# Patient Record
Sex: Female | Born: 1985 | Race: Black or African American | Hispanic: No | Marital: Single | Smoking: Never smoker
Health system: Southern US, Community
[De-identification: ages and names within clinical notes are randomized; demographics above are authoritative.]

## PROBLEM LIST (undated history)

## (undated) ENCOUNTER — Inpatient Hospital Stay (HOSPITAL_COMMUNITY): Payer: Self-pay

## (undated) DIAGNOSIS — O26649 Intrahepatic cholestasis of pregnancy, unspecified trimester: Secondary | ICD-10-CM

## (undated) DIAGNOSIS — O26619 Liver and biliary tract disorders in pregnancy, unspecified trimester: Secondary | ICD-10-CM

## (undated) DIAGNOSIS — F419 Anxiety disorder, unspecified: Secondary | ICD-10-CM

## (undated) DIAGNOSIS — D649 Anemia, unspecified: Secondary | ICD-10-CM

## (undated) DIAGNOSIS — K831 Obstruction of bile duct: Secondary | ICD-10-CM

## (undated) DIAGNOSIS — A749 Chlamydial infection, unspecified: Secondary | ICD-10-CM

## (undated) DIAGNOSIS — G4730 Sleep apnea, unspecified: Secondary | ICD-10-CM

## (undated) DIAGNOSIS — T7840XA Allergy, unspecified, initial encounter: Secondary | ICD-10-CM

## (undated) HISTORY — DX: Allergy, unspecified, initial encounter: T78.40XA

## (undated) HISTORY — PX: CHOLECYSTECTOMY: SHX55

## (undated) HISTORY — DX: Anemia, unspecified: D64.9

## (undated) HISTORY — PX: NO PAST SURGERIES: SHX2092

## (undated) HISTORY — DX: Sleep apnea, unspecified: G47.30

## (undated) HISTORY — DX: Anxiety disorder, unspecified: F41.9

---
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
## 2006-07-19 ENCOUNTER — Other Ambulatory Visit: Admission: RE | Admit: 2006-07-19 | Discharge: 2006-07-19 | Payer: Self-pay | Admitting: Obstetrics and Gynecology

## 2007-02-19 ENCOUNTER — Inpatient Hospital Stay (HOSPITAL_COMMUNITY): Admission: AD | Admit: 2007-02-19 | Discharge: 2007-02-19 | Payer: Self-pay | Admitting: Obstetrics & Gynecology

## 2007-10-11 ENCOUNTER — Emergency Department (HOSPITAL_COMMUNITY): Admission: EM | Admit: 2007-10-11 | Discharge: 2007-10-11 | Payer: Self-pay | Admitting: Emergency Medicine

## 2008-02-05 ENCOUNTER — Other Ambulatory Visit: Admission: RE | Admit: 2008-02-05 | Discharge: 2008-02-05 | Payer: Self-pay | Admitting: Obstetrics and Gynecology

## 2009-03-19 ENCOUNTER — Inpatient Hospital Stay (HOSPITAL_COMMUNITY): Admission: AD | Admit: 2009-03-19 | Discharge: 2009-03-19 | Payer: Self-pay | Admitting: Obstetrics and Gynecology

## 2010-04-12 LAB — HCG, QUANTITATIVE, PREGNANCY: hCG, Beta Chain, Quant, S: 97 m[IU]/mL — ABNORMAL HIGH (ref <5)

## 2010-04-12 LAB — GC/CHLAMYDIA PROBE AMP, GENITAL
Chlamydia, DNA Probe: NEGATIVE
GC Probe Amp, Genital: NEGATIVE

## 2010-04-12 LAB — WET PREP, GENITAL: Yeast Wet Prep HPF POC: NONE SEEN

## 2010-04-12 LAB — URINALYSIS, ROUTINE W REFLEX MICROSCOPIC
Bilirubin Urine: NEGATIVE
Hgb urine dipstick: NEGATIVE
Specific Gravity, Urine: 1.015 (ref 1.005–1.030)

## 2010-04-12 LAB — POCT PREGNANCY, URINE: Preg Test, Ur: POSITIVE

## 2010-10-09 LAB — URINALYSIS, ROUTINE W REFLEX MICROSCOPIC
Glucose, UA: NEGATIVE
Hgb urine dipstick: NEGATIVE
Urobilinogen, UA: 0.2

## 2010-10-09 LAB — WET PREP, GENITAL

## 2010-10-09 LAB — GC/CHLAMYDIA PROBE AMP, GENITAL
Chlamydia, DNA Probe: NEGATIVE
GC Probe Amp, Genital: NEGATIVE

## 2010-10-09 LAB — POCT PREGNANCY, URINE: Preg Test, Ur: NEGATIVE

## 2011-01-19 NOTE — L&D Delivery Note (Signed)
Delivery Note At 3:25 AM a viable and healthy female was delivered via Vaginal, Spontaneous Delivery (Presentation: Left Occiput Anterior).  APGAR: 8, 9; weight pending .   Placenta status: Intact, Spontaneous.  Cord: 3 vessels with the following complications: .  Cord pH: n/a  Anesthesia: Epidural  Episiotomy: None Lacerations: 1st degree bilat vaginal wall; no repair Suture Repair: n/a Est. Blood Loss (mL): 500  Mom to postpartum.  Baby to NICU - preterm   Dr. Maisie Fus attended delivery with me present  [facility] 08/09/2011, 4:01 AM

## 2011-01-21 ENCOUNTER — Encounter (HOSPITAL_COMMUNITY): Payer: Self-pay | Admitting: *Deleted

## 2011-01-21 ENCOUNTER — Inpatient Hospital Stay (HOSPITAL_COMMUNITY): Payer: Medicaid Other

## 2011-01-21 ENCOUNTER — Inpatient Hospital Stay (HOSPITAL_COMMUNITY)
Admission: AD | Admit: 2011-01-21 | Discharge: 2011-01-21 | Disposition: A | Discharge status: Home / Self Care | Payer: Medicaid Other | Source: Ambulatory Visit | Attending: Obstetrics and Gynecology | Admitting: Obstetrics and Gynecology

## 2011-01-21 DIAGNOSIS — R109 Unspecified abdominal pain: Secondary | ICD-10-CM | POA: Insufficient documentation

## 2011-01-21 DIAGNOSIS — Z1389 Encounter for screening for other disorder: Secondary | ICD-10-CM

## 2011-01-21 DIAGNOSIS — O2340 Unspecified infection of urinary tract in pregnancy, unspecified trimester: Secondary | ICD-10-CM

## 2011-01-21 DIAGNOSIS — N390 Urinary tract infection, site not specified: Secondary | ICD-10-CM | POA: Insufficient documentation

## 2011-01-21 DIAGNOSIS — Z3490 Encounter for supervision of normal pregnancy, unspecified, unspecified trimester: Secondary | ICD-10-CM

## 2011-01-21 DIAGNOSIS — O2390 Unspecified genitourinary tract infection in pregnancy, unspecified trimester: Secondary | ICD-10-CM | POA: Insufficient documentation

## 2011-01-21 HISTORY — DX: Chlamydial infection, unspecified: A74.9

## 2011-01-21 LAB — URINALYSIS, ROUTINE W REFLEX MICROSCOPIC
Glucose, UA: NEGATIVE mg/dL
Hgb urine dipstick: NEGATIVE
Protein, ur: NEGATIVE mg/dL
Specific Gravity, Urine: <1.005 — ABNORMAL LOW (ref 1.005–1.030)
Urobilinogen, UA: 0.2 mg/dL (ref 0.0–1.0)
pH: 6 (ref 5.0–8.0)

## 2011-01-21 LAB — CBC
MCH: 28 pg (ref 26.0–34.0)
WBC: 9.3 10*3/uL (ref 4.0–10.5)

## 2011-01-21 LAB — DIFFERENTIAL
Basophils Absolute: 0 10*3/uL (ref 0.0–0.1)
Basophils Relative: 0 % (ref 0–1)
Eosinophils Relative: 1 % (ref 0–5)
Monocytes Absolute: 0.9 10*3/uL (ref 0.1–1.0)
Monocytes Relative: 9 % (ref 3–12)
Neutro Abs: 4.9 10*3/uL (ref 1.7–7.7)
Neutrophils Relative %: 53 % (ref 43–77)

## 2011-01-21 LAB — WET PREP, GENITAL
Trich, Wet Prep: NONE SEEN
Yeast Wet Prep HPF POC: NONE SEEN

## 2011-01-21 LAB — URINE MICROSCOPIC-ADD ON

## 2011-01-21 LAB — POCT PREGNANCY, URINE: Preg Test, Ur: POSITIVE

## 2011-01-21 MED ORDER — CEPHALEXIN 500 MG PO CAPS: 500.0000 mg | ORAL_CAPSULE | Freq: Four times a day (QID) | ORAL | Status: AC

## 2011-01-21 NOTE — ED Provider Notes (Signed)
History     CSN: [id number]  Arrival date & time 01/21/11  1126   None     Chief Complaint  Patient presents with  . Abdominal Pain    HPI Renee Skinner is a 26 y.o. female at approximately [redacted] weeks gestation who presents to [facility] for abdominal pain that started 2 days ago. Denies vaginal bleeding but does have discharge. LMP 11/30/10 Positive HPT. Starting prenatal care with her OB/GYN in [place] but lives in [place] so came here today.   Past Medical History  Diagnosis Date  . Chlamydia     Past Surgical History  Procedure Date  . No past surgeries     No family history on file.  History  Substance Use Topics  . Smoking status: Never Smoker   . Smokeless tobacco: Not on file  . Alcohol Use: No    OB History    Grav Para Term Preterm Abortions TAB SAB Ect Mult Living   2 0   1 1  0  0      Review of Systems  Constitutional: Positive for fatigue. Negative for fever, chills and diaphoresis.  HENT: Negative for ear pain, congestion, sore throat, facial swelling, neck pain, neck stiffness, dental problem and sinus pressure.   Eyes: Negative for photophobia, pain and discharge.  Respiratory: Negative for cough, chest tightness and wheezing.   Cardiovascular: Negative.   Gastrointestinal: Positive for nausea, abdominal pain and constipation. Negative for vomiting, diarrhea and abdominal distention.  Genitourinary: Positive for urgency, frequency and vaginal discharge. Negative for dysuria, flank pain, vaginal bleeding and difficulty urinating.  Musculoskeletal: Positive for back pain. Negative for myalgias and gait problem.  Skin: Negative for color change and rash.  Neurological: Negative for dizziness, speech difficulty, weakness, light-headedness, numbness and headaches.  Psychiatric/Behavioral: Negative for confusion and agitation. The patient is not nervous/anxious.     Allergies  Review of patient's allergies indicates not on file.  Home Medications  No  current outpatient prescriptions on file.  BP 114/68  Pulse 88  Temp(Src) 98.4 F (36.9 C) (Oral)  Resp 20  Ht 5\' 3"  (1.6 m)  Wt 198 lb (89.812 kg)  BMI 35.07 kg/m2  SpO2 99%  LMP 11/30/2010  Physical Exam  Nursing note and vitals reviewed. Constitutional: She is oriented to person, place, and time. She appears well-developed and well-nourished.  HENT:  Head: Normocephalic.  Eyes: EOM are normal.  Neck: Neck supple.  Cardiovascular: Normal rate.   Pulmonary/Chest: Effort normal.  Abdominal: Soft. There is no tenderness.       Unable to reproduce the pain that the patient has had.  Genitourinary:       External genitalia without lesions. White discharge vaginal vault.  Cervix, long, closed, no CMT, no adnexal tenderness. Uterus slightly enlarged.  Musculoskeletal: Normal range of motion.  Neurological: She is alert and oriented to person, place, and time. No cranial nerve deficit.  Skin: Skin is warm and dry.  Psychiatric: She has a normal mood and affect. Her behavior is normal. Judgment and thought content normal.   . Results for orders placed during the hospital encounter of 01/21/11 (from the past 24 hour(s))  URINALYSIS, ROUTINE W REFLEX MICROSCOPIC     Status: Abnormal   Collection Time   01/21/11 12:05 PM      Component Value Range   Color, Urine YELLOW  YELLOW    APPearance CLOUDY (*) CLEAR    Specific Gravity, Urine <1.005 (*) 1.005 - 1.030  pH 6.0  5.0 - 8.0    Glucose, UA NEGATIVE  NEGATIVE (mg/dL)   Hgb urine dipstick NEGATIVE  NEGATIVE    Bilirubin Urine NEGATIVE  NEGATIVE    Ketones, ur NEGATIVE  NEGATIVE (mg/dL)   Protein, ur NEGATIVE  NEGATIVE (mg/dL)   Urobilinogen, UA 0.2  0.0 - 1.0 (mg/dL)   Nitrite NEGATIVE  NEGATIVE    Leukocytes, UA SMALL (*) NEGATIVE   URINE MICROSCOPIC-ADD ON     Status: Abnormal   Collection Time   01/21/11 12:05 PM      Component Value Range   Squamous Epithelial / LPF MANY (*) RARE    WBC, UA 7-10  <3 (WBC/hpf)    Bacteria, UA FEW (*) RARE   POCT PREGNANCY, URINE     Status: Normal   Collection Time   01/21/11 12:09 PM      Component Value Range   Preg Test, Ur POSITIVE    HCG, QUANTITATIVE, PREGNANCY     Status: Abnormal   Collection Time   01/21/11  1:00 PM      Component Value Range   hCG, Beta Chain, Quant, S 47200 (*) <5 (mIU/mL)  CBC     Status: Normal   Collection Time   01/21/11  1:00 PM      Component Value Range   WBC 9.3  4.0 - 10.5 (K/uL)   RBC 4.39  3.87 - 5.11 (MIL/uL)   Hemoglobin 12.3  12.0 - 15.0 (g/dL)   HCT 14.7  82.9 - 56.2 (%)   MCV 84.1  78.0 - 100.0 (fL)   MCH 28.0  26.0 - 34.0 (pg)   MCHC 33.3  30.0 - 36.0 (g/dL)   RDW 13.0  86.5 - 78.4 (%)   Platelets 287  150 - 400 (K/uL)  DIFFERENTIAL     Status: Normal   Collection Time   01/21/11  1:00 PM      Component Value Range   Neutrophils Relative 53  43 - 77 (%)   Neutro Abs 4.9  1.7 - 7.7 (K/uL)   Lymphocytes Relative 36  12 - 46 (%)   Lymphs Abs 3.3  0.7 - 4.0 (K/uL)   Monocytes Relative 9  3 - 12 (%)   Monocytes Absolute 0.9  0.1 - 1.0 (K/uL)   Eosinophils Relative 1  0 - 5 (%)   Eosinophils Absolute 0.1  0.0 - 0.7 (K/uL)   Basophils Relative 0  0 - 1 (%)   Basophils Absolute 0.0  0.0 - 0.1 (K/uL)  WET PREP, GENITAL     Status: Abnormal   Collection Time   01/21/11  1:30 PM      Component Value Range   Yeast, Wet Prep NONE SEEN  NONE SEEN    Trich, Wet Prep NONE SEEN  NONE SEEN    Clue Cells, Wet Prep FEW (*) NONE SEEN    WBC, Wet Prep HPF POC FEW (*) NONE SEEN    Ultrasound today shows a 7 week 3 day IUP with cardiac activity and a CLC on the left.  Assessment: Viable IUP    UTI  Plan:  GC, Chlamydia, UC pending   Keflex 500 mg po qid x 7 days   Follow up with OB in [facility]  ED Course  Procedures MDM          Kerrie Buffalo, NP 01/21/11 1614

## 2011-01-21 NOTE — Progress Notes (Signed)
Patient states she has had a positive pregnancy test about one month ago. Has been having lower abdominal pain for 2 days, worse at night, but no pain at this time, No bleeding but does have a heavy discharge that is normal for her.

## 2011-01-21 NOTE — ED Notes (Addendum)
Pt stated she had positive pregnancy test at her doctors office month ago. [facility] preg test is positive as well.

## 2011-01-22 LAB — GC/CHLAMYDIA PROBE AMP, GENITAL: Chlamydia, DNA Probe: NEGATIVE

## 2011-01-25 NOTE — ED Provider Notes (Signed)
Agree with above note.  Renee Skinner 01/25/2011 10:11 AM   

## 2011-03-31 ENCOUNTER — Ambulatory Visit (INDEPENDENT_AMBULATORY_CARE_PROVIDER_SITE_OTHER): Payer: Medicaid Other | Admitting: Family Medicine

## 2011-03-31 ENCOUNTER — Other Ambulatory Visit: Payer: Self-pay | Admitting: Obstetrics and Gynecology

## 2011-03-31 VITALS — BP 121/77 | Wt 209.0 lb

## 2011-03-31 DIAGNOSIS — Z3480 Encounter for supervision of other normal pregnancy, unspecified trimester: Secondary | ICD-10-CM

## 2011-03-31 NOTE — Progress Notes (Signed)
Patient is here for new ob intake.  She is doing well, but has had not had prenatal care except for an ultrasound done at [facility] at 7wks 3days.  I offered genetic testing but they would like to think about it at this point, they will let us know when they return for the next ob visit in two weeks. Baby is active on ultrasound with positive fetal heart rate.

## 2011-04-01 LAB — OBSTETRIC PANEL
Eosinophils Relative: 1 % (ref 0–5)
Hepatitis B Surface Ag: NEGATIVE
Lymphocytes Relative: 33 % (ref 12–46)
Lymphs Abs: 3.8 10*3/uL (ref 0.7–4.0)
MCV: 84.3 fL (ref 78.0–100.0)
Neutrophils Relative %: 59 % (ref 43–77)
Platelets: 287 10*3/uL (ref 150–400)
RBC: 4.14 MIL/uL (ref 3.87–5.11)
Rubella: 45.6 IU/mL — ABNORMAL HIGH
WBC: 11.4 10*3/uL — ABNORMAL HIGH (ref 4.0–10.5)

## 2011-04-07 ENCOUNTER — Ambulatory Visit (HOSPITAL_COMMUNITY)
Admission: RE | Admit: 2011-04-07 | Discharge: 2011-04-07 | Disposition: A | Discharge status: Home / Self Care | Payer: Medicaid Other | Source: Ambulatory Visit | Attending: Obstetrics and Gynecology | Admitting: Obstetrics and Gynecology

## 2011-04-07 DIAGNOSIS — Z363 Encounter for antenatal screening for malformations: Secondary | ICD-10-CM | POA: Insufficient documentation

## 2011-04-07 DIAGNOSIS — O358XX0 Maternal care for other (suspected) fetal abnormality and damage, not applicable or unspecified: Secondary | ICD-10-CM | POA: Insufficient documentation

## 2011-04-07 DIAGNOSIS — Z3480 Encounter for supervision of other normal pregnancy, unspecified trimester: Secondary | ICD-10-CM

## 2011-04-07 DIAGNOSIS — Z1389 Encounter for screening for other disorder: Secondary | ICD-10-CM | POA: Insufficient documentation

## 2011-04-09 ENCOUNTER — Ambulatory Visit (HOSPITAL_COMMUNITY): Payer: Medicaid Other

## 2011-04-19 ENCOUNTER — Ambulatory Visit (INDEPENDENT_AMBULATORY_CARE_PROVIDER_SITE_OTHER): Payer: Medicaid Other | Admitting: Family Medicine

## 2011-04-19 ENCOUNTER — Other Ambulatory Visit (HOSPITAL_COMMUNITY)
Admission: RE | Admit: 2011-04-19 | Discharge: 2011-04-19 | Disposition: A | Discharge status: Home / Self Care | Payer: Medicaid Other | Source: Ambulatory Visit | Attending: Family Medicine | Admitting: Family Medicine

## 2011-04-19 ENCOUNTER — Encounter: Payer: Self-pay | Admitting: Family Medicine

## 2011-04-19 VITALS — BP 124/78 | Wt 214.0 lb

## 2011-04-19 DIAGNOSIS — O0990 Supervision of high risk pregnancy, unspecified, unspecified trimester: Secondary | ICD-10-CM | POA: Insufficient documentation

## 2011-04-19 DIAGNOSIS — G5603 Carpal tunnel syndrome, bilateral upper limbs: Secondary | ICD-10-CM | POA: Insufficient documentation

## 2011-04-19 DIAGNOSIS — Z3400 Encounter for supervision of normal first pregnancy, unspecified trimester: Secondary | ICD-10-CM

## 2011-04-19 DIAGNOSIS — G5600 Carpal tunnel syndrome, unspecified upper limb: Secondary | ICD-10-CM

## 2011-04-19 DIAGNOSIS — Z01419 Encounter for gynecological examination (general) (routine) without abnormal findings: Secondary | ICD-10-CM | POA: Insufficient documentation

## 2011-04-19 NOTE — Progress Notes (Signed)
[id number] 

## 2011-04-19 NOTE — Progress Notes (Signed)
   Subjective:    Renee Skinner is a G2P0010 [redacted]w[redacted]d being seen today for her first obstetrical visit.  Her obstetrical history is significant for nothing. Patient does intend to breast feed. Pregnancy history fully reviewed.  Patient reports no complaints.  Filed Vitals:   04/19/11 1518  BP: 124/78  Weight: 214 lb (97.07 kg)    HISTORY: OB History    Grav Para Term Preterm Abortions TAB SAB Ect Mult Living   2 0   1 1  0  0     # Outc Date GA Lbr Len/2nd Wgt Sex Del Anes PTL Lv   1 TAB            2 CUR              Past Medical History  Diagnosis Date  . Chlamydia    Past Surgical History  Procedure Date  . No past surgeries    Family History  Problem Relation Age of Onset  . Hypertension Father   . Cancer Maternal Grandmother     Breast/tn  52 when she passed     Exam    Uterine Size: size equals dates  Pelvic Exam:    Perineum: Normal Perineum   Vulva: normal   Vagina:  normal mucosa       Cervix: no lesions   Adnexa: normal adnexa   Bony Pelvis: average  System: Breast:  normal appearance, no masses or tenderness   Skin: normal coloration and turgor, no rashes    Neurologic: oriented, normal   Extremities: Strength, tone and muscle mass are abnormal.   HEENT sclera clear, anicteric, neck supple with midline trachea and thyroid without masses   Mouth/Teeth mucous membranes moist, pharynx normal without lesions   Neck supple   Cardiovascular: regular rate and rhythm   Respiratory:  appears well, vitals normal, no respiratory distress, acyanotic, normal RR, ear and throat exam is normal, neck free of mass or lymphadenopathy, chest clear, no wheezing, crepitations, rhonchi, normal symmetric air entry   Abdomen: soft, non-tender; bowel sounds normal; no masses,  no organomegaly   Urinary: urethral meatus normal      Assessment:    Pregnancy: G2P0010 Patient Active Problem List  Diagnoses  . Supervision of normal first pregnancy        Plan:      Initial labs drawn. Prenatal vitamins. Problem list reviewed and updated. Genetic Screening discussed Quad Screen: declined.  Ultrasound discussed; fetal survey: results reviewed.  Follow up in 4 weeks.  Wrist splints for carpal tunnel, other wrist hygiene discussed. Aftan Vint S 04/19/2011

## 2011-04-19 NOTE — Patient Instructions (Signed)
Pregnancy - Second Trimester The second trimester of pregnancy (3 to 6 months) is a period of rapid growth for you and your baby. At the end of the sixth month, your baby is about 9 inches long and weighs 1 1/2 pounds. You will begin to feel the baby move between 18 and 20 weeks of the pregnancy. This is called quickening. Weight gain is faster. A clear fluid (colostrum) may leak out of your breasts. You may feel small contractions of the womb (uterus). This is known as false labor or Braxton-Hicks contractions. This is like a practice for labor when the baby is ready to be born. Usually, the problems with morning sickness have usually passed by the end of your first trimester. Some women develop small dark blotches (called cholasma, mask of pregnancy) on their face that usually goes away after the baby is born. Exposure to the sun makes the blotches worse. Acne may also develop in some pregnant women and pregnant women who have acne, may find that it goes away. PRENATAL EXAMS  Blood work may continue to be done during prenatal exams. These tests are done to check on your health and the probable health of your baby. Blood work is used to follow your blood levels (hemoglobin). Anemia (low hemoglobin) is common during pregnancy. Iron and vitamins are given to help prevent this. You will also be checked for diabetes between 24 and 28 weeks of the pregnancy. Some of the previous blood tests may be repeated.   The size of the uterus is measured during each visit. This is to make sure that the baby is continuing to grow properly according to the dates of the pregnancy.   Your blood pressure is checked every prenatal visit. This is to make sure you are not getting toxemia.   Your urine is checked to make sure you do not have an infection, diabetes or protein in the urine.   Your weight is checked often to make sure gains are happening at the suggested rate. This is to ensure that both you and your baby are  growing normally.   Sometimes, an ultrasound is performed to confirm the proper growth and development of the baby. This is a test which bounces harmless sound waves off the baby so your caregiver can more accurately determine due dates.  Sometimes, a specialized test is done on the amniotic fluid surrounding the baby. This test is called an amniocentesis. The amniotic fluid is obtained by sticking a needle into the belly (abdomen). This is done to check the chromosomes in instances where there is a concern about possible genetic problems with the baby. It is also sometimes done near the end of pregnancy if an early delivery is required. In this case, it is done to help make sure the baby's lungs are mature enough for the baby to live outside of the womb. CHANGES OCCURING IN THE SECOND TRIMESTER OF PREGNANCY Your body goes through many changes during pregnancy. They vary from person to person. Talk to your caregiver about changes you notice that you are concerned about.  During the second trimester, you will likely have an increase in your appetite. It is normal to have cravings for certain foods. This varies from person to person and pregnancy to pregnancy.   Your lower abdomen will begin to bulge.   You may have to urinate more often because the uterus and baby are pressing on your bladder. It is also common to get more bladder infections during pregnancy (  pain with urination). You can help this by drinking lots of fluids and emptying your bladder before and after intercourse.   You may begin to get stretch marks on your hips, abdomen, and breasts. These are normal changes in the body during pregnancy. There are no exercises or medications to take that prevent this change.   You may begin to develop swollen and bulging veins (varicose veins) in your legs. Wearing support hose, elevating your feet for 15 minutes, 3 to 4 times a day and limiting salt in your diet helps lessen the problem.    Heartburn may develop as the uterus grows and pushes up against the stomach. Antacids recommended by your caregiver helps with this problem. Also, eating smaller meals 4 to 5 times a day helps.   Constipation can be treated with a stool softener or adding bulk to your diet. Drinking lots of fluids, vegetables, fruits, and whole grains are helpful.   Exercising is also helpful. If you have been very active up until your pregnancy, most of these activities can be continued during your pregnancy. If you have been less active, it is helpful to start an exercise program such as walking.   Hemorrhoids (varicose veins in the rectum) may develop at the end of the second trimester. Warm sitz baths and hemorrhoid cream recommended by your caregiver helps hemorrhoid problems.   Backaches may develop during this time of your pregnancy. Avoid heavy lifting, wear low heal shoes and practice good posture to help with backache problems.   Some pregnant women develop tingling and numbness of their hand and fingers because of swelling and tightening of ligaments in the wrist (carpel tunnel syndrome). This goes away after the baby is born.   As your breasts enlarge, you may have to get a bigger bra. Get a comfortable, cotton, support bra. Do not get a nursing bra until the last month of the pregnancy if you will be nursing the baby.   You may get a dark line from your belly button to the pubic area called the linea nigra.   You may develop rosy cheeks because of increase blood flow to the face.   You may develop spider looking lines of the face, neck, arms and chest. These go away after the baby is born.  HOME CARE INSTRUCTIONS   It is extremely important to avoid all smoking, herbs, alcohol, and unprescribed drugs during your pregnancy. These chemicals affect the formation and growth of the baby. Avoid these chemicals throughout the pregnancy to ensure the delivery of a healthy infant.   Most of your home  care instructions are the same as suggested for the first trimester of your pregnancy. Keep your caregiver's appointments. Follow your caregiver's instructions regarding medication use, exercise and diet.   During pregnancy, you are providing food for you and your baby. Continue to eat regular, well-balanced meals. Choose foods such as meat, fish, milk and other low fat dairy products, vegetables, fruits, and whole-grain breads and cereals. Your caregiver will tell you of the ideal weight gain.   A physical sexual relationship may be continued up until near the end of pregnancy if there are no other problems. Problems could include early (premature) leaking of amniotic fluid from the membranes, vaginal bleeding, abdominal pain, or other medical or pregnancy problems.   Exercise regularly if there are no restrictions. Check with your caregiver if you are unsure of the safety of some of your exercises. The greatest weight gain will occur in the   last 2 trimesters of pregnancy. Exercise will help you:   Control your weight.   Get you in shape for labor and delivery.   Lose weight after you have the baby.   Wear a good support or jogging bra for breast tenderness during pregnancy. This may help if worn during sleep. Pads or tissues may be used in the bra if you are leaking colostrum.   Do not use hot tubs, steam rooms or saunas throughout the pregnancy.   Wear your seat belt at all times when driving. This protects you and your baby if you are in an accident.   Avoid raw meat, uncooked cheese, cat litter boxes and soil used by cats. These carry germs that can cause birth defects in the baby.   The second trimester is also a good time to visit your dentist for your dental health if this has not been done yet. Getting your teeth cleaned is OK. Use a soft toothbrush. Brush gently during pregnancy.   It is easier to loose urine during pregnancy. Tightening up and strengthening the pelvic muscles will  help with this problem. Practice stopping your urination while you are going to the bathroom. These are the same muscles you need to strengthen. It is also the muscles you would use as if you were trying to stop from passing gas. You can practice tightening these muscles up 10 times a set and repeating this about 3 times per day. Once you know what muscles to tighten up, do not perform these exercises during urination. It is more likely to contribute to an infection by backing up the urine.   Ask for help if you have financial, counseling or nutritional needs during pregnancy. Your caregiver will be able to offer counseling for these needs as well as refer you for other special needs.   Your skin may become oily. If so, wash your face with mild soap, use non-greasy moisturizer and oil or cream based makeup.  MEDICATIONS AND DRUG USE IN PREGNANCY  Take prenatal vitamins as directed. The vitamin should contain 1 milligram of folic acid. Keep all vitamins out of reach of children. Only a couple vitamins or tablets containing iron may be fatal to a baby or young child when ingested.   Avoid use of all medications, including herbs, over-the-counter medications, not prescribed or suggested by your caregiver. Only take over-the-counter or prescription medicines for pain, discomfort, or fever as directed by your caregiver. Do not use aspirin.   Let your caregiver also know about herbs you may be using.   Alcohol is related to a number of birth defects. This includes fetal alcohol syndrome. All alcohol, in any form, should be avoided completely. Smoking will cause low birth rate and premature babies.   Street or illegal drugs are very harmful to the baby. They are absolutely forbidden. A baby born to an addicted mother will be addicted at birth. The baby will go through the same withdrawal an adult does.  SEEK MEDICAL CARE IF:  You have any concerns or worries during your pregnancy. It is better to call with  your questions if you feel they cannot wait, rather than worry about them. SEEK IMMEDIATE MEDICAL CARE IF:   An unexplained oral temperature above 102 F (38.9 C) develops, or as your caregiver suggests.   You have leaking of fluid from the vagina (birth canal). If leaking membranes are suspected, take your temperature and tell your caregiver of this when you call.   There   is vaginal spotting, bleeding, or passing clots. Tell your caregiver of the amount and how many pads are used. Light spotting in pregnancy is common, especially following intercourse.   You develop a bad smelling vaginal discharge with a change in the color from clear to white.   You continue to feel sick to your stomach (nauseated) and have no relief from remedies suggested. You vomit blood or coffee ground-like materials.   You lose more than 2 pounds of weight or gain more than 2 pounds of weight over 1 week, or as suggested by your caregiver.   You notice swelling of your face, hands, feet, or legs.   You get exposed to German measles and have never had them.   You are exposed to fifth disease or chickenpox.   You develop belly (abdominal) pain. Round ligament discomfort is a common non-cancerous (benign) cause of abdominal pain in pregnancy. Your caregiver still must evaluate you.   You develop a bad headache that does not go away.   You develop fever, diarrhea, pain with urination, or shortness of breath.   You develop visual problems, blurry, or double vision.   You fall or are in a car accident or any kind of trauma.   There is mental or physical violence at home.  Document Released: 12/29/2000 Document Revised: 12/24/2010 Document Reviewed: 07/03/2008 [employer] Patient Information 2012 [employer]. Breastfeeding BENEFITS OF BREASTFEEDING For the baby  The first milk (colostrum) helps the baby's digestive system function better.   There are antibodies from the mother in the milk that help the  baby fight off infections.   The baby has a lower incidence of asthma, allergies, and SIDS (sudden infant death syndrome).   The nutrients in breast milk are better than formulas for the baby and helps the baby's brain grow better.   Babies who breastfeed have less gas, colic, and constipation.  For the mother  Breastfeeding helps develop a very special bond between mother and baby.   It is more convenient, always available at the correct temperature and cheaper than formula feeding.   It burns calories in the mother and helps with losing weight that was gained during pregnancy.   It makes the uterus contract back down to normal size faster and slows bleeding following delivery.   Breastfeeding mothers have a lower risk of developing breast cancer.  NURSE FREQUENTLY  A healthy, full-term baby may breastfeed as often as every hour or space his or her feedings to every 3 hours.   How often to nurse will vary from baby to baby. Watch your baby for signs of hunger, not the clock.   Nurse as often as the baby requests, or when you feel the need to reduce the fullness of your breasts.   Awaken the baby if it has been 3 to 4 hours since the last feeding.   Frequent feeding will help the mother make more milk and will prevent problems like sore nipples and engorgement of the breasts.  BABY'S POSITION AT THE BREAST  Whether lying down or sitting, be sure that the baby's tummy is facing your tummy.   Support the breast with 4 fingers underneath the breast and the thumb above. Make sure your fingers are well away from the nipple and baby's mouth.   Stroke the baby's lips and cheek closest to the breast gently with your finger or nipple.   When the baby's mouth is open wide enough, place all of your nipple and as much   of the dark area around the nipple as possible into your baby's mouth.   Pull the baby in close so the tip of the nose and the baby's cheeks touch the breast during the  feeding.  FEEDINGS  The length of each feeding varies from baby to baby and from feeding to feeding.   The baby must suck about 2 to 3 minutes for your milk to get to him or her. This is called a "let down." For this reason, allow the baby to feed on each breast as long as he or she wants. Your baby will end the feeding when he or she has received the right balance of nutrients.   To break the suction, put your finger into the corner of the baby's mouth and slide it between his or her gums before removing your breast from his or her mouth. This will help prevent sore nipples.  REDUCING BREAST ENGORGEMENT  In the first week after your baby is born, you may experience signs of breast engorgement. When breasts are engorged, they feel heavy, warm, full, and may be tender to the touch. You can reduce engorgement if you:   Nurse frequently, every 2 to 3 hours. Mothers who breastfeed early and often have fewer problems with engorgement.   Place light ice packs on your breasts between feedings. This reduces swelling. Wrap the ice packs in a lightweight towel to protect your skin.   Apply moist hot packs to your breast for 5 to 10 minutes before each feeding. This increases circulation and helps the milk flow.   Gently massage your breast before and during the feeding.   Make sure that the baby empties at least one breast at every feeding before switching sides.   Use a breast pump to empty the breasts if your baby is sleepy or not nursing well. You may also want to pump if you are returning to work or or you feel you are getting engorged.   Avoid bottle feeds, pacifiers or supplemental feedings of water or juice in place of breastfeeding.   Be sure the baby is latched on and positioned properly while breastfeeding.   Prevent fatigue, stress, and anemia.   Wear a supportive bra, avoiding underwire styles.   Eat a balanced diet with enough fluids.  If you follow these suggestions, your  engorgement should improve in 24 to 48 hours. If you are still experiencing difficulty, call your lactation consultant or caregiver. IS MY BABY GETTING ENOUGH MILK? Sometimes, mothers worry about whether their babies are getting enough milk. You can be assured that your baby is getting enough milk if:  The baby is actively sucking and you hear swallowing.   The baby nurses at least 8 to 12 times in a 24 hour time period. Nurse your baby until he or she unlatches or falls asleep at the first breast (at least 10 to 20 minutes), then offer the second side.   The baby is wetting 5 to 6 disposable diapers (6 to 8 cloth diapers) in a 24 hour period by 5 to 6 days of age.   The baby is having at least 2 to 3 stools every 24 hours for the first few months. Breast milk is all the food your baby needs. It is not necessary for your baby to have water or formula. In fact, to help your breasts make more milk, it is best not to give your baby supplemental feedings during the early weeks.   The stool should   be soft and yellow.   The baby should gain 4 to 7 ounces per week after he is 4 days old.  TAKE CARE OF YOURSELF Take care of your breasts by:  Bathing or showering daily.   Avoiding the use of soaps on your nipples.   Start feedings on your left breast at one feeding and on your right breast at the next feeding.   You will notice an increase in your milk supply 2 to 5 days after delivery. You may feel some discomfort from engorgement, which makes your breasts very firm and often tender. Engorgement "peaks" out within 24 to 48 hours. In the meantime, apply warm moist towels to your breasts for 5 to 10 minutes before feeding. Gentle massage and expression of some milk before feeding will soften your breasts, making it easier for your baby to latch on. Wear a well fitting nursing bra and air dry your nipples for 10 to 15 minutes after each feeding.   Only use cotton bra pads.   Only use pure lanolin on  your nipples after nursing. You do not need to wash it off before nursing.  Take care of yourself by:   Eating well-balanced meals and nutritious snacks.   Drinking milk, fruit juice, and water to satisfy your thirst (about 8 glasses a day).   Getting plenty of rest.   Increasing calcium in your diet (1200 mg a day).   Avoiding foods that you notice affect the baby in a bad way.  SEEK MEDICAL CARE IF:   You have any questions or difficulty with breastfeeding.   You need help.   You have a hard, red, sore area on your breast, accompanied by a fever of 100.5 F (38.1 C) or more.   Your baby is too sleepy to eat well or is having trouble sleeping.   Your baby is wetting less than 6 diapers per day, by 5 days of age.   Your baby's skin or white part of his or her eyes is more yellow than it was in the hospital.   You feel depressed.  Document Released: 01/04/2005 Document Revised: 12/24/2010 Document Reviewed: 08/19/2008 [employer] Patient Information 2012 [employer]. 

## 2011-04-22 ENCOUNTER — Encounter: Payer: Self-pay | Admitting: Family Medicine

## 2011-05-17 ENCOUNTER — Telehealth: Payer: Self-pay | Admitting: *Deleted

## 2011-05-17 DIAGNOSIS — Z3400 Encounter for supervision of normal first pregnancy, unspecified trimester: Secondary | ICD-10-CM

## 2011-05-17 MED ORDER — PRENATAL MULTIVITAMIN CH: 1.0000 | ORAL_TABLET | Freq: Every day | ORAL | Status: DC

## 2011-05-17 NOTE — Telephone Encounter (Signed)
Patient needs prenatal vitamins called into [facility].Marland Kitchen

## 2011-05-18 ENCOUNTER — Ambulatory Visit (INDEPENDENT_AMBULATORY_CARE_PROVIDER_SITE_OTHER): Payer: Medicaid Other | Admitting: Obstetrics and Gynecology

## 2011-05-18 VITALS — BP 121/75 | Wt 222.0 lb

## 2011-05-18 DIAGNOSIS — G5603 Carpal tunnel syndrome, bilateral upper limbs: Secondary | ICD-10-CM

## 2011-05-18 DIAGNOSIS — Z3400 Encounter for supervision of normal first pregnancy, unspecified trimester: Secondary | ICD-10-CM

## 2011-05-18 DIAGNOSIS — G5600 Carpal tunnel syndrome, unspecified upper limb: Secondary | ICD-10-CM

## 2011-05-18 NOTE — Progress Notes (Signed)
Patient doing well without complaints. 1hr GCT next visit

## 2011-05-18 NOTE — Progress Notes (Signed)
Patient is here for routine visit, she is doing well.

## 2011-06-15 ENCOUNTER — Ambulatory Visit (INDEPENDENT_AMBULATORY_CARE_PROVIDER_SITE_OTHER): Payer: Medicaid Other | Admitting: Obstetrics & Gynecology

## 2011-06-15 ENCOUNTER — Encounter: Payer: Self-pay | Admitting: Obstetrics & Gynecology

## 2011-06-15 VITALS — BP 109/75 | Wt 224.0 lb

## 2011-06-15 DIAGNOSIS — Z3400 Encounter for supervision of normal first pregnancy, unspecified trimester: Secondary | ICD-10-CM

## 2011-06-15 NOTE — Progress Notes (Signed)
Routine visit. Labs and glucola today. No problems except allergies. OTC meds recommended.

## 2011-06-16 LAB — CBC
HCT: 33.7 % — ABNORMAL LOW (ref 36.0–46.0)
MCV: 86.2 fL (ref 78.0–100.0)
RBC: 3.91 MIL/uL (ref 3.87–5.11)
WBC: 11.3 10*3/uL — ABNORMAL HIGH (ref 4.0–10.5)

## 2011-06-18 ENCOUNTER — Telehealth: Payer: Self-pay | Admitting: *Deleted

## 2011-06-18 NOTE — Telephone Encounter (Signed)
Patient was notified of glucose tolerance test results and will come in Monday for her 3 hr screening.  She was advised not to eat or drink anything after midnight.

## 2011-06-21 ENCOUNTER — Other Ambulatory Visit (INDEPENDENT_AMBULATORY_CARE_PROVIDER_SITE_OTHER): Payer: Medicaid Other | Admitting: *Deleted

## 2011-06-21 DIAGNOSIS — R7309 Other abnormal glucose: Secondary | ICD-10-CM

## 2011-06-21 NOTE — Progress Notes (Signed)
Patient is here for 3 hour glucose tolerance test. 

## 2011-06-22 LAB — GLUCOSE TOLERANCE, 3 HOURS
Glucose Tolerance, 2 hour: 115 mg/dL (ref 70–164)
Glucose, GTT - 3 Hour: 96 mg/dL (ref 70–144)

## 2011-06-29 ENCOUNTER — Ambulatory Visit (INDEPENDENT_AMBULATORY_CARE_PROVIDER_SITE_OTHER): Payer: Medicaid Other | Admitting: Obstetrics & Gynecology

## 2011-06-29 VITALS — BP 108/74 | Wt 226.0 lb

## 2011-06-29 DIAGNOSIS — Z3400 Encounter for supervision of normal first pregnancy, unspecified trimester: Secondary | ICD-10-CM

## 2011-06-29 NOTE — Progress Notes (Signed)
Abnormal 1 hr GTT, passed 3 hr GTT.  No other complaints or concerns.  Fetal movement and labor precautions reviewed.

## 2011-06-29 NOTE — Patient Instructions (Signed)
Breastfeeding BENEFITS OF BREASTFEEDING For the baby  The first milk (colostrum) helps the baby's digestive system function better.   There are antibodies from the mother in the milk that help the baby fight off infections.   The baby has a lower incidence of asthma, allergies, and SIDS (sudden infant death syndrome).   The nutrients in breast milk are better than formulas for the baby and helps the baby's brain grow better.   Babies who breastfeed have less gas, colic, and constipation.  For the mother  Breastfeeding helps develop a very special bond between mother and baby.   It is more convenient, always available at the correct temperature and cheaper than formula feeding.   It burns calories in the mother and helps with losing weight that was gained during pregnancy.   It makes the uterus contract back down to normal size faster and slows bleeding following delivery.   Breastfeeding mothers have a lower risk of developing breast cancer.  NURSE FREQUENTLY  A healthy, full-term baby may breastfeed as often as every hour or space his or her feedings to every 3 hours.   How often to nurse will vary from baby to baby. Watch your baby for signs of hunger, not the clock.   Nurse as often as the baby requests, or when you feel the need to reduce the fullness of your breasts.   Awaken the baby if it has been 3 to 4 hours since the last feeding.   Frequent feeding will help the mother make more milk and will prevent problems like sore nipples and engorgement of the breasts.  BABY'S POSITION AT THE BREAST  Whether lying down or sitting, be sure that the baby's tummy is facing your tummy.   Support the breast with 4 fingers underneath the breast and the thumb above. Make sure your fingers are well away from the nipple and baby's mouth.   Stroke the baby's lips and cheek closest to the breast gently with your finger or nipple.   When the baby's mouth is open wide enough, place all  of your nipple and as much of the dark area around the nipple as possible into your baby's mouth.   Pull the baby in close so the tip of the nose and the baby's cheeks touch the breast during the feeding.  FEEDINGS  The length of each feeding varies from baby to baby and from feeding to feeding.   The baby must suck about 2 to 3 minutes for your milk to get to him or her. This is called a "let down." For this reason, allow the baby to feed on each breast as long as he or she wants. Your baby will end the feeding when he or she has received the right balance of nutrients.   To break the suction, put your finger into the corner of the baby's mouth and slide it between his or her gums before removing your breast from his or her mouth. This will help prevent sore nipples.  REDUCING BREAST ENGORGEMENT  In the first week after your baby is born, you may experience signs of breast engorgement. When breasts are engorged, they feel heavy, warm, full, and may be tender to the touch. You can reduce engorgement if you:   Nurse frequently, every 2 to 3 hours. Mothers who breastfeed early and often have fewer problems with engorgement.   Place light ice packs on your breasts between feedings. This reduces swelling. Wrap the ice packs in a   lightweight towel to protect your skin.   Apply moist hot packs to your breast for 5 to 10 minutes before each feeding. This increases circulation and helps the milk flow.   Gently massage your breast before and during the feeding.   Make sure that the baby empties at least one breast at every feeding before switching sides.   Use a breast pump to empty the breasts if your baby is sleepy or not nursing well. You may also want to pump if you are returning to work or or you feel you are getting engorged.   Avoid bottle feeds, pacifiers or supplemental feedings of water or juice in place of breastfeeding.   Be sure the baby is latched on and positioned properly while  breastfeeding.   Prevent fatigue, stress, and anemia.   Wear a supportive bra, avoiding underwire styles.   Eat a balanced diet with enough fluids.  If you follow these suggestions, your engorgement should improve in 24 to 48 hours. If you are still experiencing difficulty, call your lactation consultant or caregiver. IS MY BABY GETTING ENOUGH MILK? Sometimes, mothers worry about whether their babies are getting enough milk. You can be assured that your baby is getting enough milk if:  The baby is actively sucking and you hear swallowing.   The baby nurses at least 8 to 12 times in a 24 hour time period. Nurse your baby until he or she unlatches or falls asleep at the first breast (at least 10 to 20 minutes), then offer the second side.   The baby is wetting 5 to 6 disposable diapers (6 to 8 cloth diapers) in a 24 hour period by 5 to 6 days of age.   The baby is having at least 2 to 3 stools every 24 hours for the first few months. Breast milk is all the food your baby needs. It is not necessary for your baby to have water or formula. In fact, to help your breasts make more milk, it is best not to give your baby supplemental feedings during the early weeks.   The stool should be soft and yellow.   The baby should gain 4 to 7 ounces per week after he is 4 days old.  TAKE CARE OF YOURSELF Take care of your breasts by:  Bathing or showering daily.   Avoiding the use of soaps on your nipples.   Start feedings on your left breast at one feeding and on your right breast at the next feeding.   You will notice an increase in your milk supply 2 to 5 days after delivery. You may feel some discomfort from engorgement, which makes your breasts very firm and often tender. Engorgement "peaks" out within 24 to 48 hours. In the meantime, apply warm moist towels to your breasts for 5 to 10 minutes before feeding. Gentle massage and expression of some milk before feeding will soften your breasts, making  it easier for your baby to latch on. Wear a well fitting nursing bra and air dry your nipples for 10 to 15 minutes after each feeding.   Only use cotton bra pads.   Only use pure lanolin on your nipples after nursing. You do not need to wash it off before nursing.  Take care of yourself by:   Eating well-balanced meals and nutritious snacks.   Drinking milk, fruit juice, and water to satisfy your thirst (about 8 glasses a day).   Getting plenty of rest.   Increasing calcium in   your diet (1200 mg a day).   Avoiding foods that you notice affect the baby in a bad way.  SEEK MEDICAL CARE IF:   You have any questions or difficulty with breastfeeding.   You need help.   You have a hard, red, sore area on your breast, accompanied by a fever of 100.5 F (38.1 C) or more.   Your baby is too sleepy to eat well or is having trouble sleeping.   Your baby is wetting less than 6 diapers per day, by 5 days of age.   Your baby's skin or white part of his or her eyes is more yellow than it was in the hospital.   You feel depressed.  Document Released: 01/04/2005 Document Revised: 12/24/2010 Document Reviewed: 08/19/2008 [employer] Patient Information 2012 [employer]. 

## 2011-06-29 NOTE — Progress Notes (Signed)
Routine prenatal check. 

## 2011-07-13 ENCOUNTER — Encounter: Payer: Self-pay | Admitting: Obstetrics & Gynecology

## 2011-07-13 ENCOUNTER — Ambulatory Visit (INDEPENDENT_AMBULATORY_CARE_PROVIDER_SITE_OTHER): Payer: Medicaid Other | Admitting: Obstetrics & Gynecology

## 2011-07-13 VITALS — BP 115/70 | Wt 230.0 lb

## 2011-07-13 DIAGNOSIS — Z3400 Encounter for supervision of normal first pregnancy, unspecified trimester: Secondary | ICD-10-CM

## 2011-07-13 NOTE — Progress Notes (Signed)
Routine visit. No problems. Denies VB, ROM, CTXs. Reports good FM. We discussed risks of excess weight gain during pregnancy.

## 2011-07-15 ENCOUNTER — Encounter (HOSPITAL_COMMUNITY): Payer: Self-pay | Admitting: *Deleted

## 2011-07-15 ENCOUNTER — Inpatient Hospital Stay (HOSPITAL_COMMUNITY)
Admission: AD | Admit: 2011-07-15 | Discharge: 2011-07-15 | Disposition: A | Discharge status: Home / Self Care | Payer: Medicaid Other | Source: Ambulatory Visit | Attending: Obstetrics & Gynecology | Admitting: Obstetrics & Gynecology

## 2011-07-15 DIAGNOSIS — Z331 Pregnant state, incidental: Secondary | ICD-10-CM

## 2011-07-15 DIAGNOSIS — R748 Abnormal levels of other serum enzymes: Secondary | ICD-10-CM

## 2011-07-15 DIAGNOSIS — IMO0002 Reserved for concepts with insufficient information to code with codable children: Secondary | ICD-10-CM | POA: Insufficient documentation

## 2011-07-15 DIAGNOSIS — Z3400 Encounter for supervision of normal first pregnancy, unspecified trimester: Secondary | ICD-10-CM

## 2011-07-15 LAB — URINE MICROSCOPIC-ADD ON

## 2011-07-15 LAB — COMPREHENSIVE METABOLIC PANEL
AST: 76 U/L — ABNORMAL HIGH (ref 0–37)
BUN: 8 mg/dL (ref 6–23)
CO2: 23 mEq/L (ref 19–32)
Calcium: 9 mg/dL (ref 8.4–10.5)
Creatinine, Ser: 0.61 mg/dL (ref 0.50–1.10)
GFR calc Af Amer: >90 mL/min (ref >90)
GFR calc non Af Amer: >90 mL/min (ref >90)
Total Bilirubin: 0.2 mg/dL — ABNORMAL LOW (ref 0.3–1.2)

## 2011-07-15 LAB — CBC
HCT: 33.9 % — ABNORMAL LOW (ref 36.0–46.0)
MCH: 27.6 pg (ref 26.0–34.0)
MCV: 85 fL (ref 78.0–100.0)
Platelets: 313 10*3/uL (ref 150–400)
RBC: 3.99 MIL/uL (ref 3.87–5.11)

## 2011-07-15 LAB — PROTEIN / CREATININE RATIO, URINE
Creatinine, Urine: 110.62 mg/dL
Protein Creatinine Ratio: 0.14 (ref 0.00–0.15)
Total Protein, Urine: 15.5 mg/dL

## 2011-07-15 LAB — URINALYSIS, ROUTINE W REFLEX MICROSCOPIC
Bilirubin Urine: NEGATIVE
Nitrite: NEGATIVE
Protein, ur: NEGATIVE mg/dL
Specific Gravity, Urine: 1.025 (ref 1.005–1.030)
Urobilinogen, UA: 0.2 mg/dL (ref 0.0–1.0)

## 2011-07-15 MED ORDER — NIFEDIPINE 10 MG PO CAPS
10.0000 mg | ORAL_CAPSULE | ORAL | Status: AC
  Administered 2011-07-15 (×3): 10 mg via ORAL
  Filled 2011-07-15 (×3): qty 1

## 2011-07-15 NOTE — MAU Provider Note (Signed)
History     CSN: [id number]  Arrival date and time: 07/15/11 7829   None     Chief Complaint  Patient presents with  . Leg Swelling   HPI  Renee Skinner is a 26 yo AAF [id number] [redacted]w[redacted]d who presents with a 2 d h/o bilateral leg swelling. Pt reports she has had leg swelling, usually relieved by elevation. Swelling not relieved by elevation, epsom salt bath overf the past 2 days. Associated sxs include heat intolerance and palpitations. PT denies any recent drastic WG, CP, urinary sxs. Pt denies any change to activity or any new medications.  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  Pt endorses Braxton Hick's contractions and fetal movement, but denies any LOF or bleeding.     OB History    Grav Para Term Preterm Abortions TAB SAB Ect Mult Living   2 0   1 1  0  0      Past Medical History  Diagnosis Date  . Chlamydia     Past Surgical History  Procedure Date  . No past surgeries     Family History  Problem Relation Age of Onset  . Hypertension Father   . Cancer Maternal Grandmother     Breast/tn  72 when she passed    History  Substance Use Topics  . Smoking status: Never Smoker   . Smokeless tobacco: Not on file  . Alcohol Use: No    Allergies: No Known Allergies  Prescriptions prior to admission  Medication Sig Dispense Refill  . Prenatal Vit-Fe Fumarate-FA (PRENATAL MULTIVITAMIN) TABS Take 1 tablet by mouth daily.  30 tablet  11    Review of Systems  Constitutional: Negative for fever, chills and malaise/fatigue.  HENT: Negative for ear pain, congestion and sore throat.   Eyes: Negative for blurred vision, double vision, photophobia and pain.       "spots" in vision, intermittent, last experienced 3 days ago  Respiratory: Positive for shortness of breath. Negative for cough and wheezing.   Cardiovascular:  Positive for palpitations (wil supine position or sitting at work, intermittent, last felt a few days ago). Negative for chest pain and orthopnea.  Gastrointestinal: Negative for heartburn, nausea, vomiting, abdominal pain, diarrhea, constipation and blood in stool.  Genitourinary: Negative for dysuria, urgency, frequency and hematuria.  Skin: Negative.   Neurological: Positive for sensory change (bil foot "tingling" with swelling). Negative for dizziness, seizures, weakness and headaches.  Psychiatric/Behavioral: Negative for depression and substance abuse. The patient is not nervous/anxious.    Physical Exam   Blood pressure 116/71, pulse 139, temperature 97.8 F (36.6 C), temperature source Oral, resp. rate 18, height 5\' 2"  (1.575 m), weight 232 lb (105.235 kg), last menstrual period 11/30/2010, SpO2 100.00%.  Physical Exam  Constitutional: She is oriented to person, place, and time. She appears well-developed and well-nourished. No distress.  HENT:  Head: Normocephalic.  Eyes: Pupils are equal, round, and reactive to light. No scleral icterus.  Neck: Normal range of motion.  Cardiovascular: Regular rhythm, normal heart sounds and intact distal pulses.   No murmur heard.      Tachycardic 116  Respiratory: Effort normal and breath sounds normal. No respiratory distress. She has no wheezes.  GI: Soft. Bowel sounds are normal. She exhibits distension. There is tenderness ("uncomfortable" RLQ "tightness"). There  is no rebound, no guarding, no CVA tenderness and no tenderness at McBurney's point.  Musculoskeletal: She exhibits edema (nonpitting, bil LE to knee).       NEG bil Homan's, no palpable cords, erythema or tenderness  Neurological: She is alert and oriented to person, place, and time. She has normal strength. No sensory deficit (light touch and pain intact bil LE ).  Reflex Scores:      Patellar reflexes are 2+ on the right side and 2+ on the left side.      Achilles reflexes are  2+ on the right side and 2+ on the left side. Skin: Skin is warm and dry. No erythema.   Abd: gravid, non tender Cervix: closed/long/thick/post FHR: Cat I tracing, irregular contractions with UI.  Results for orders placed during the hospital encounter of 07/15/11 (from the past 24 hour(s))  URINALYSIS, ROUTINE W REFLEX MICROSCOPIC     Status: Abnormal   Collection Time   07/15/11  9:35 AM      Component Value Range   Color, Urine YELLOW  YELLOW   APPearance CLEAR  CLEAR   Specific Gravity, Urine 1.025  1.005 - 1.030   pH 7.0  5.0 - 8.0   Glucose, UA NEGATIVE  NEGATIVE mg/dL   Hgb urine dipstick NEGATIVE  NEGATIVE   Bilirubin Urine NEGATIVE  NEGATIVE   Ketones, ur NEGATIVE  NEGATIVE mg/dL   Protein, ur NEGATIVE  NEGATIVE mg/dL   Urobilinogen, UA 0.2  0.0 - 1.0 mg/dL   Nitrite NEGATIVE  NEGATIVE   Leukocytes, UA SMALL (*) NEGATIVE  URINE MICROSCOPIC-ADD ON     Status: Abnormal   Collection Time   07/15/11  9:35 AM      Component Value Range   Squamous Epithelial / LPF MANY (*) RARE   WBC, UA 3-6  <3 WBC/hpf   RBC / HPF 0-2  <3 RBC/hpf   Bacteria, UA RARE  RARE  PROTEIN / CREATININE RATIO, URINE     Status: Normal   Collection Time   07/15/11  9:35 AM      Component Value Range   Creatinine, Urine 110.62     Total Protein, Urine 15.5     PROTEIN CREATININE RATIO 0.14  0.00 - 0.15  CBC     Status: Abnormal   Collection Time   07/15/11 10:52 AM      Component Value Range   WBC 10.5  4.0 - 10.5 K/uL   RBC 3.99  3.87 - 5.11 MIL/uL   Hemoglobin 11.0 (*) 12.0 - 15.0 g/dL   HCT 40.9 (*) 81.1 - 91.4 %   MCV 85.0  78.0 - 100.0 fL   MCH 27.6  26.0 - 34.0 pg   MCHC 32.4  30.0 - 36.0 g/dL   RDW 78.2  95.6 - 21.3 %   Platelets 313  150 - 400 K/uL  COMPREHENSIVE METABOLIC PANEL     Status: Abnormal   Collection Time   07/15/11 10:52 AM      Component Value Range   Sodium 135  135 - 145 mEq/L   Potassium 3.8  3.5 - 5.1 mEq/L   Chloride 103  96 - 112 mEq/L   CO2 23  19 - 32 mEq/L     Glucose, Bld 105 (*) 70 - 99 mg/dL   BUN 8  6 - 23 mg/dL   Creatinine, Ser 0.86  0.50 - 1.10 mg/dL   Calcium 9.0  8.4 - 57.8 mg/dL   Total Protein  6.9  6.0 - 8.3 g/dL   Albumin 2.6 (*) 3.5 - 5.2 g/dL   AST 76 (*) 0 - 37 U/L   ALT 171 (*) 0 - 35 U/L   Alkaline Phosphatase 261 (*) 39 - 117 U/L   Total Bilirubin 0.2 (*) 0.3 - 1.2 mg/dL   GFR calc non Af Amer >90  >90 mL/min   GFR calc Af Amer >90  >90 mL/min   MAU Course  Procedures  MDM UA - small leukocytes CBC - mild anemia CMP - low albumin, elevated LFTs Orthostatics EKG - sinus tachycardia Urine Pro/Creat: .014 Procardia: 30 mg   MD Consult: Discussed with Dr. Debroah Loop agrees with plan  Assessment and Plan  1. LE Edema 2. Elevated liver enzyme with unknown etiology  Discharge home Precautions reviewed FU Monday at [facility] office to repeat bloodwork   Delania Ferg E. 07/15/2011, 2:04 PM

## 2011-07-15 NOTE — MAU Note (Signed)
Patient state she has had swelling in the lower legs and feet for 2 days. Swelling usually goes away but has not. Having some mild contractions. Denies any bleeding or leaking and reports good fetal movement, Denies any headaches or visual problems.

## 2011-07-15 NOTE — Discharge Instructions (Signed)
Hypertension During Pregnancy Hypertension is also called high blood pressure. It can occur at any time in life and during pregnancy. When you have hypertension, there is extra pressure inside your blood vessels that carry blood from the heart to the rest of your body (arteries). Hypertension during pregnancy can cause problems for you and your baby. Your baby might not weigh as much as it should at birth or might be born early (premature). Very bad cases of hypertension during pregnancy can be life-threatening.  There are different types of hypertension during pregnancy.   Chronic hypertension. This happens when a woman has hypertension before pregnancy and it continues during pregnancy.   Gestational hypertension. This is when hypertension develops during pregnancy.   Preeclampsia or toxemia of pregnancy. This is a very serious type of hypertension that develops only during pregnancy. It is a disease that affects the whole body (systemic) and can be very dangerous for both mother and baby.   Gestational hypertension and preeclampsia usually go away after your baby is born. Blood pressure generally stabilizes within 6 weeks. Women who have hypertension during pregnancy have a greater chance of developing hypertension later in life or with future pregnancies. UNDERSTANDING BLOOD PRESSURE Blood pressure moves blood in your body. Sometimes, the force that moves the blood becomes too strong.  A blood pressure reading is given in 2 numbers and looks like a fraction.   The top number is called the systolic pressure. When your heart beats, it forces more blood to flow through the arteries. Pressure inside the arteries goes up.   The bottom number is the diastolic pressure. Pressure goes down between beats. That is when the heart is resting.   You may have hypertension if:   Your systolic blood pressure is above 140.   Your diastolic pressure is above 90.  RISK FACTORS Some factors make you more  likely to develop hypertension during pregnancy. Risk factors include:  Having hypertension before pregnancy.   Having hypertension during a previous pregnancy.   Being overweight.   Being older than 40.   Being pregnant with more than 1 baby (multiples).   Having diabetes or kidney problems.  SYMPTOMS Chronic and gestational hypertension may not cause symptoms. Preeclampsia has symptoms, which may include:  Increased protein in your urine. Your caregiver will check for this at every prenatal visit.   Swelling of your hands and face.   Rapid weight gain.   Headaches.   Visual changes.   Being bothered by light.   Abdominal pain, especially in the right upper area.   Chest pain.   Shortness of breath.   Increased reflexes.   Seizures. Seizures occur with a more severe form of preeclampsia, called eclampsia.  DIAGNOSIS   You may be diagnosed with hypertension during pregnancy during a regular prenatal exam. At each visit, tests may include:   Blood pressure checks.   A urine test to check for protein in your urine.   The type of hypertension you are diagnosed with depends on when you developed it. It also depends on your specific blood pressure reading.   Developing hypertension before 20 weeks of pregnancy is consistent with chronic hypertension.   Developing hypertension after 20 weeks of pregnancy is consistent with gestational hypertension.   Hypertension with increased urinary protein is diagnosed as preeclampsia.   Blood pressure measurements that stay above 160 systolic or 110 diastolic are a sign of severe preeclampsia.  TREATMENT Treatment for hypertension during pregnancy varies. Treatment depends on   the type of hypertension and how serious it is.  If you take medicine for chronic hypertension, you may need to switch medicines.   Drugs called ACE inhibitors should not be taken during pregnancy.   Low-dose aspirin may be suggested for women who have  risk factors for preeclampsia.   If you have gestational hypertension, you may need to take a blood pressure medicine that is safe during pregnancy. Your caregiver will recommend the appropriate medicine.   If you have severe preeclampsia, you may need to be in the hospital. Caregivers will watch you and the baby very closely. You also may need to take medicine (magnesium sulfate) to prevent seizures and lower blood pressure.   Sometimes an early delivery is needed. This may be the case if the condition worsens. It would be done to protect you and the baby. The only cure for preeclampsia is delivery.  HOME CARE INSTRUCTIONS  Schedule and keep all of your regular prenatal care.   Follow your caregiver's instructions for taking medicines. Tell your caregiver about all medicines you take. This includes over-the-counter medicines.   Eat as little salt as possible.   Get regular exercise.   Do not drink alcohol.   Do not use tobacco products.   Do not drink products with caffeine.   Lie on your left side when resting.   Tell your doctor if you have any preeclampsia symptoms.  SEEK IMMEDIATE MEDICAL CARE IF:  You have severe abdominal pain.   You have sudden swelling in the hands, ankles, or face.   You gain 4 pounds (1.8 kg) or more in 1 week.   You vomit repeatedly.   You have vaginal bleeding.   You do not feel the baby moving as much.   You have a headache.   You have blurred or double vision.   You have muscle twitching or spasms.   You have shortness of breath.   You have blue fingernails and lips.   You have blood in your urine.  MAKE SURE YOU:  Understand these instructions.   Will watch your condition.   Will get help right away if you are not doing well.  Document Released: 09/22/2010 Document Revised: 12/24/2010 Document Reviewed: 09/22/2010 [facility] Patient Information 2012 [place], [place].Preterm Labor Preterm labor is when labor starts at less than  37 weeks of pregnancy. The normal length of a pregnancy is 39 to 41 weeks. CAUSES Often, there is no identifiable underlying cause as to why a woman goes into preterm labor. However, one of the most common known causes of preterm labor is infection. Infections of the uterus, cervix, vagina, amniotic sac, bladder, kidney, or even the lungs (pneumonia) can cause labor to start. Other causes of preterm labor include:  Urogenital infections, such as yeast infections and bacterial vaginosis.   Uterine abnormalities (uterine shape, uterine septum, fibroids, bleeding from the placenta).   A cervix that has been operated on and opens prematurely.   Malformations in the baby.   Multiple gestations (twins, triplets, and so on).   Breakage of the amniotic sac.  Additional risk factors for preterm labor include:  Previous history of preterm labor.   Premature rupture of membranes (PROM).   A placenta that covers the opening of the cervix (placenta previa).   A placenta that separates from the uterus (placenta abruption).   A cervix that is too weak to hold the baby in the uterus (incompetence cervix).   Having too much fluid in the amniotic sac (  polyhydramnios).   Taking illegal drugs or smoking while pregnant.   Not gaining enough weight while pregnant.   Women younger than 21 and older than 25 years old.   Low socioeconomic status.   African-American ethnicity.  SYMPTOMS Signs and symptoms of preterm labor include:  Menstrual-like cramps.   Contractions that are 30 to 70 seconds apart, become very regular, closer together, and are more intense and painful.   Contractions that start on the top of the uterus and spread down to the lower abdomen and back.   A sense of increased pelvic pressure or back pain.   A watery or bloody discharge that comes from the vagina.  DIAGNOSIS  A diagnosis can be confirmed by:  A vaginal exam.   An ultrasound of the cervix.   Sampling  (swabbing) cervico-vaginal secretions. These samples can be tested for the presence of fetal fibronectin. This is a protein found in cervical discharge which is associated with preterm labor.   Fetal monitoring.  TREATMENT  Depending on the length of the pregnancy and other circumstances, a caregiver may suggest bed rest. If necessary, there are medicines that can be given to stop contractions and to quicken fetal lung maturity. If labor happens before 34 weeks of pregnancy, a prolonged hospital stay may be recommended. Treatment depends on the condition of both the mother and baby. PREVENTION There are some things a mother can do to lower the risk of preterm labor in future pregnancies. A woman can:   Stop smoking.   Maintain healthy weight gain and avoid chemicals and drugs that are not necessary.   Be watchful for any type of infection.   Inform her caregiver if she has a known history of preterm labor.  Document Released: 03/27/2003 Document Revised: 12/24/2010 Document Reviewed: 05/01/2010 [facility] Patient Information 2012 [place], [place].

## 2011-07-19 ENCOUNTER — Ambulatory Visit (INDEPENDENT_AMBULATORY_CARE_PROVIDER_SITE_OTHER): Payer: Medicaid Other | Admitting: Obstetrics & Gynecology

## 2011-07-19 VITALS — BP 119/75 | Wt 230.0 lb

## 2011-07-19 DIAGNOSIS — R748 Abnormal levels of other serum enzymes: Secondary | ICD-10-CM

## 2011-07-19 DIAGNOSIS — Z3400 Encounter for supervision of normal first pregnancy, unspecified trimester: Secondary | ICD-10-CM

## 2011-07-19 NOTE — Progress Notes (Signed)
Seen in MAU on 07/15/11  for BLE edema, workup revealed elevated LFTs.  No preeclampsia symptoms. No other complaints or concerns.  Will recheck CMET today to follow up LFTS, normal BP today. Fetal movement and labor precautions reviewed.

## 2011-07-19 NOTE — Patient Instructions (Signed)
Return to clinic for any obstetric concerns or go to MAU for evaluation  

## 2011-07-20 ENCOUNTER — Encounter: Payer: Self-pay | Admitting: Obstetrics & Gynecology

## 2011-07-20 LAB — COMPREHENSIVE METABOLIC PANEL
ALT: 177 U/L — ABNORMAL HIGH (ref 0–35)
AST: 107 U/L — ABNORMAL HIGH (ref 0–37)
Alkaline Phosphatase: 259 U/L — ABNORMAL HIGH (ref 39–117)
BUN: 11 mg/dL (ref 6–23)
Chloride: 109 mEq/L (ref 96–112)
Creat: 0.53 mg/dL (ref 0.50–1.10)
Total Bilirubin: 0.3 mg/dL (ref 0.3–1.2)

## 2011-07-20 NOTE — Addendum Note (Signed)
Addended by: Jaynie Collins A on: 07/20/2011 01:33 PM   Modules accepted: Orders

## 2011-07-20 NOTE — Progress Notes (Signed)
Patient needs more labs for evaluation of persistent elevated LFTs. Needs repeat CMET, CBC, bile acids, amylase, lipase, hepatitis panel checked. She will be called to make appointment for lab draw.  Follow up results and manage accordingly.

## 2011-07-21 ENCOUNTER — Other Ambulatory Visit (INDEPENDENT_AMBULATORY_CARE_PROVIDER_SITE_OTHER): Payer: Medicaid Other | Admitting: Gynecology

## 2011-07-21 DIAGNOSIS — K831 Obstruction of bile duct: Secondary | ICD-10-CM

## 2011-07-21 DIAGNOSIS — R748 Abnormal levels of other serum enzymes: Secondary | ICD-10-CM

## 2011-07-21 LAB — CBC
HCT: 34.8 % — ABNORMAL LOW (ref 36.0–46.0)
Hemoglobin: 11.4 g/dL — ABNORMAL LOW (ref 12.0–15.0)
MCH: 27.5 pg (ref 26.0–34.0)
MCHC: 32.8 g/dL (ref 30.0–36.0)
MCV: 84.1 fL (ref 78.0–100.0)
RDW: 14.7 % (ref 11.5–15.5)

## 2011-07-21 NOTE — Progress Notes (Signed)
Spoke with patient regarding her labs result. Per Patient will be coming in office today for recollection of blood drawn.

## 2011-07-22 LAB — COMPREHENSIVE METABOLIC PANEL
ALT: 163 U/L — ABNORMAL HIGH (ref 0–35)
Albumin: 3.4 g/dL — ABNORMAL LOW (ref 3.5–5.2)
CO2: 24 mEq/L (ref 19–32)
Chloride: 105 mEq/L (ref 96–112)
Glucose, Bld: 110 mg/dL — ABNORMAL HIGH (ref 70–99)
Potassium: 3.7 mEq/L (ref 3.5–5.3)
Sodium: 139 mEq/L (ref 135–145)
Total Bilirubin: 0.2 mg/dL — ABNORMAL LOW (ref 0.3–1.2)
Total Protein: 6.9 g/dL (ref 6.0–8.3)

## 2011-07-23 ENCOUNTER — Ambulatory Visit (HOSPITAL_COMMUNITY)
Admission: RE | Admit: 2011-07-23 | Discharge: 2011-07-23 | Disposition: A | Discharge status: Home / Self Care | Payer: Medicaid Other | Source: Ambulatory Visit | Attending: Obstetrics & Gynecology | Admitting: Obstetrics & Gynecology

## 2011-07-23 DIAGNOSIS — Z3689 Encounter for other specified antenatal screening: Secondary | ICD-10-CM | POA: Insufficient documentation

## 2011-07-23 DIAGNOSIS — O26619 Liver and biliary tract disorders in pregnancy, unspecified trimester: Secondary | ICD-10-CM | POA: Insufficient documentation

## 2011-07-23 DIAGNOSIS — K838 Other specified diseases of biliary tract: Secondary | ICD-10-CM | POA: Insufficient documentation

## 2011-07-23 LAB — BILE ACIDS, TOTAL: Bile Acids Total: 32 umol/L — ABNORMAL HIGH (ref 0–19)

## 2011-07-23 MED ORDER — URSODIOL 500 MG PO TABS: 500.0000 mg | ORAL_TABLET | Freq: Two times a day (BID) | ORAL | Status: DC

## 2011-07-23 NOTE — Addendum Note (Signed)
Addended by: Jaynie Collins A on: 07/23/2011 02:29 PM   Modules accepted: Orders

## 2011-07-23 NOTE — Progress Notes (Addendum)
Attending Result Note Patient with elevated LFTs, labs for evaluation are as reported:  Results for orders placed in visit on 07/21/11 (from the past 48 hour(s))  CBC     Status: Abnormal   Collection Time   07/21/11  3:03 PM      Component Value Range Comment   WBC 10.8 (*) 4.0 - 10.5 K/uL    RBC 4.14  3.87 - 5.11 MIL/uL    Hemoglobin 11.4 (*) 12.0 - 15.0 g/dL    HCT 16.1 (*) 09.6 - 46.0 %    MCV 84.1  78.0 - 100.0 fL    MCH 27.5  26.0 - 34.0 pg    MCHC 32.8  30.0 - 36.0 g/dL    RDW 04.5  40.9 - 81.1 %    Platelets 343  150 - 400 K/uL   COMPREHENSIVE METABOLIC PANEL     Status: Abnormal   Collection Time   07/21/11  3:03 PM      Component Value Range Comment   Sodium 139  135 - 145 mEq/L    Potassium 3.7  3.5 - 5.3 mEq/L    Chloride 105  96 - 112 mEq/L    CO2 24  19 - 32 mEq/L    Glucose, Bld 110 (*) 70 - 99 mg/dL    BUN 11  6 - 23 mg/dL    Creat 9.14  7.82 - 9.56 mg/dL    Total Bilirubin 0.2 (*) 0.3 - 1.2 mg/dL    Alkaline Phosphatase 283 (*) 39 - 117 U/L    AST 79 (*) 0 - 37 U/L    ALT 163 (*) 0 - 35 U/L    Total Protein 6.9  6.0 - 8.3 g/dL    Albumin 3.4 (*) 3.5 - 5.2 g/dL    Calcium 9.2  8.4 - 21.3 mg/dL   BILE ACIDS, TOTAL     Status: Abnormal   Collection Time   07/21/11  3:03 PM      Component Value Range Comment   Bile Acids Total 32 (*) 0 - 19 umol/L   HEPATITIS PANEL, ACUTE     Status: Normal (Preliminary result)   Collection Time   07/21/11  3:03 PM      Component Value Range Comment   Hepatitis B Surface Ag        Hep B C IgM        Hep A IgM        HCV Ab    NEGATIVE   LIPASE     Status: Normal   Collection Time   07/21/11  3:03 PM      Component Value Range Comment   Lipase 24  0 - 75 U/L   AMYLASE     Status: Normal   Collection Time   07/21/11  3:03 PM      Component Value Range Comment   Amylase 38  0 - 105 U/L    Discussed results with patient; she has elevated bile acids concerning for intrahepatic cholestasis of pregnancy (ICP).  Patient reports  having pruritus for the past couple of days on palms and soles of feet, and on abdomen with no rash. She was told this was expected with ICP. Increased fetal morbidoty/mortality risks discussed with patient.  Prescribed Ursodiol 500 mg po bid; she will be on this until the end of the pregnancy.  Patient also told to come today for ultrasound for growth and BPP today; she will start 2x/week testing and the  plan will be to deliver her at [redacted] weeks gestation or earlier if indicated. This plan was discussed with the patient; she will be set up for antenatal testing in clinic next week.   Jaynie Collins, M.D. 07/23/2011 2:34 PM

## 2011-07-26 ENCOUNTER — Other Ambulatory Visit: Payer: Self-pay

## 2011-07-26 DIAGNOSIS — O26619 Liver and biliary tract disorders in pregnancy, unspecified trimester: Secondary | ICD-10-CM

## 2011-07-26 DIAGNOSIS — K831 Obstruction of bile duct: Secondary | ICD-10-CM

## 2011-07-26 DIAGNOSIS — R748 Abnormal levels of other serum enzymes: Secondary | ICD-10-CM

## 2011-07-26 LAB — HEPATITIS PANEL, ACUTE
HCV Ab: NEGATIVE
Hepatitis B Surface Ag: NEGATIVE

## 2011-07-26 MED ORDER — URSODIOL 500 MG PO TABS: 500.0000 mg | ORAL_TABLET | Freq: Two times a day (BID) | ORAL | Status: DC

## 2011-07-26 NOTE — Telephone Encounter (Signed)
Rx was e-prescribed to [facility] as her default pharmacy. It has now been reordered to [facility] as per patient request, patient called to inform her to pick up medication. Will recheck LFTs, bile salts on Wednesday during her prenatal visit.

## 2011-07-26 NOTE — Addendum Note (Signed)
Addended by: Jaynie Collins A on: 07/26/2011 01:18 PM   Modules accepted: Orders

## 2011-07-26 NOTE — Telephone Encounter (Signed)
Hi Dr. Macon Large, I just got a call from Laverda Sorenson she states she spoke to you on Thursday she was having some itching. We are following her up on her elevated liver enzymes. She said she was suppose to get a medication called in her pharmacy but when she went to pick it up it was not there. I'm not sure what medication that was since it is not on her list. She could not remember what the medication was either but she needs it called [facility] to [facility] on [address]. Let me know if you need me to do anything else.

## 2011-07-28 ENCOUNTER — Ambulatory Visit (INDEPENDENT_AMBULATORY_CARE_PROVIDER_SITE_OTHER): Payer: Medicaid Other | Admitting: Obstetrics & Gynecology

## 2011-07-28 ENCOUNTER — Other Ambulatory Visit: Payer: Self-pay | Admitting: Obstetrics & Gynecology

## 2011-07-28 VITALS — BP 120/75 | Wt 230.0 lb

## 2011-07-28 DIAGNOSIS — K838 Other specified diseases of biliary tract: Secondary | ICD-10-CM

## 2011-07-28 DIAGNOSIS — K839 Disease of biliary tract, unspecified: Secondary | ICD-10-CM

## 2011-07-28 DIAGNOSIS — O26619 Liver and biliary tract disorders in pregnancy, unspecified trimester: Secondary | ICD-10-CM

## 2011-07-28 DIAGNOSIS — K831 Obstruction of bile duct: Secondary | ICD-10-CM

## 2011-07-28 DIAGNOSIS — K769 Liver disease, unspecified: Secondary | ICD-10-CM

## 2011-07-28 DIAGNOSIS — O0990 Supervision of high risk pregnancy, unspecified, unspecified trimester: Secondary | ICD-10-CM

## 2011-07-28 NOTE — Progress Notes (Signed)
Still itching. [place] 7/6 c/w 82%ile, cephalic

## 2011-07-28 NOTE — Patient Instructions (Signed)
Cholestasis of Pregnancy Cholestasis is a stopping or restriction of bile flow from the gallbladder into the intestine. It can be mild to severe. This problem usually happens during the final trimester of pregnancy. This condition occurs because the liver is not getting rid of its bile. The bile is part of the breakdown products of red blood cells. When these breakdown products (bilirubin) become elevated in the liver and spill into the blood, they produce jaundice. Jaundice is a yellowing of the skin. It is often seen first in the whites of the eyes. As the bilirubin becomes more elevated, it causes itching and sometimes a rash of the skin. CAUSES   Having problems inside or outside of the liver (gallstones blocking the opening of the gallbladder).   During pregnancy, it may be due to the elevated estrogen in the blood.   A response to the estrogen in birth control pills.   Drug or alcohol use.   Certain types of anemia (low red blood cells) present before the pregnancy. Let your caregiver know if you have anemia.   Jaundice in pregnancy is also seen with severe toxemia, severe and continuous vomiting (hyperemesis gravidarum), and acute fatty liver of pregnancy.   Other causes of jaundice that should be ruled out are hepatitis, chronic liver disease (cirrhosis), autoimmune hepatitis, and severe infectious mononucleosis.  This problem is usually harmless. However, it can cause early(premature) birth and threaten infant health. This type of jaundice is similar to jaundice of the newborn. If bilirubin levels become high enough, it can cause mental retardation in the baby. SYMPTOMS   Itching, mild to severe.   Jaundice.   Rash at times.  DIAGNOSIS   Increase amount of bilirubin in the blood stream.   The patient has yellow skin and the white of their eyes (jaundice).  TREATMENT  This problem disappears with delivery of the baby. Treatment is usually directed at controlling the itching. If  the problem is severe and the jaundice reaches dangerous levels, labor may be induced or a cesarean section may be performed. This does not usually cause permanent liver damage in the mother. However, the cholestasis may occur in future pregnancies. HOME CARE INSTRUCTIONS   Do not use drugs or alcohol.   Do not take any medication unless suggested by your caregiver.   Keep all follow-up appointments as directed. This is important for you and your baby's health.   Only use the creams and medications for the itching given to you by your caregiver.  SEEK IMMEDIATE MEDICAL CARE IF:   An unexplained oral temperature above 102 F (38.9 C) develops, or as your caregiver suggests.   Symptoms are not controlled by the medications or measures suggested by your caregiver. You seem to be getting worse.   You develop nausea, vomiting, or abdominal pain.   You develop a severe headache.   You develop vision problems (blurred or double vision).   You develop uterine contractions.   You develop vaginal bleeding.  Document Released: 01/02/2000 Document Revised: 12/24/2010 Document Reviewed: 07/26/2007 [facility] Patient Information 2012 [place], [place].

## 2011-07-30 ENCOUNTER — Ambulatory Visit (HOSPITAL_COMMUNITY)
Admission: RE | Admit: 2011-07-30 | Discharge: 2011-07-30 | Disposition: A | Discharge status: Home / Self Care | Payer: Medicaid Other | Source: Ambulatory Visit | Attending: Obstetrics & Gynecology | Admitting: Obstetrics & Gynecology

## 2011-07-30 ENCOUNTER — Other Ambulatory Visit: Payer: Self-pay | Admitting: Obstetrics & Gynecology

## 2011-07-30 ENCOUNTER — Encounter (HOSPITAL_COMMUNITY): Payer: Self-pay

## 2011-07-30 VITALS — BP 116/80 | HR 104 | Wt 232.0 lb

## 2011-07-30 DIAGNOSIS — O26619 Liver and biliary tract disorders in pregnancy, unspecified trimester: Secondary | ICD-10-CM | POA: Insufficient documentation

## 2011-07-30 DIAGNOSIS — O0990 Supervision of high risk pregnancy, unspecified, unspecified trimester: Secondary | ICD-10-CM

## 2011-07-30 DIAGNOSIS — K831 Obstruction of bile duct: Secondary | ICD-10-CM

## 2011-07-30 DIAGNOSIS — Z3689 Encounter for other specified antenatal screening: Secondary | ICD-10-CM | POA: Insufficient documentation

## 2011-07-30 DIAGNOSIS — K838 Other specified diseases of biliary tract: Secondary | ICD-10-CM | POA: Insufficient documentation

## 2011-07-30 NOTE — Progress Notes (Signed)
Patient seen today  for limited ultrasound and NST.  See full report in [facility].  Alpha Gula, MD  Single IUP at 34 4/7 weeks Limited ultrasound performed - Max verticle pocket 7.8 cm Normal amniotic fluid volume Reactive NST  Continue 2x weekly testing - plan delivery at 37 weeks due to intrahepatic cholestatsisof pregnancy.

## 2011-08-03 ENCOUNTER — Ambulatory Visit (INDEPENDENT_AMBULATORY_CARE_PROVIDER_SITE_OTHER): Payer: Medicaid Other | Admitting: Obstetrics & Gynecology

## 2011-08-03 VITALS — BP 110/75 | Wt 233.0 lb

## 2011-08-03 DIAGNOSIS — K839 Disease of biliary tract, unspecified: Secondary | ICD-10-CM

## 2011-08-03 DIAGNOSIS — K769 Liver disease, unspecified: Secondary | ICD-10-CM

## 2011-08-03 DIAGNOSIS — O26619 Liver and biliary tract disorders in pregnancy, unspecified trimester: Secondary | ICD-10-CM

## 2011-08-03 DIAGNOSIS — O0990 Supervision of high risk pregnancy, unspecified, unspecified trimester: Secondary | ICD-10-CM

## 2011-08-03 DIAGNOSIS — K838 Other specified diseases of biliary tract: Secondary | ICD-10-CM

## 2011-08-03 DIAGNOSIS — K831 Obstruction of bile duct: Secondary | ICD-10-CM

## 2011-08-03 MED ORDER — HYDROXYZINE HCL 25 MG PO TABS: 25.0000 mg | ORAL_TABLET | Freq: Four times a day (QID) | ORAL | Status: DC | PRN

## 2011-08-03 NOTE — Progress Notes (Signed)
Patient still itching, on Ursodiol.  Atarax prescribed to help with pruritus.  Followed by 2x/week NST, weekly AFI at [facility].  Plan is to deliver at 37 weeks; IOL scheduled on 08/16/11 at 0700.  GBS, GC/Chlam cultures done today, cervix 1/50/-2/vertex.  NST performed in clinic today was reviewed and was found to be reactive.  Continue recommended antenatal testing and prenatal care.  No other complaints or concerns.  Fetal movement and labor precautions reviewed.

## 2011-08-03 NOTE — Patient Instructions (Signed)
Cholestasis of Pregnancy Cholestasis is a stopping or restriction of bile flow from the gallbladder into the intestine. It can be mild to severe. This problem usually happens during the final trimester of pregnancy. This condition occurs because the liver is not getting rid of its bile. The bile is part of the breakdown products of red blood cells. When these breakdown products (bilirubin) become elevated in the liver and spill into the blood, they produce jaundice. Jaundice is a yellowing of the skin. It is often seen first in the whites of the eyes. As the bilirubin becomes more elevated, it causes itching and sometimes a rash of the skin. CAUSES   Having problems inside or outside of the liver (gallstones blocking the opening of the gallbladder).   During pregnancy, it may be due to the elevated estrogen in the blood.   A response to the estrogen in birth control pills.   Drug or alcohol use.   Certain types of anemia (low red blood cells) present before the pregnancy. Let your caregiver know if you have anemia.   Jaundice in pregnancy is also seen with severe toxemia, severe and continuous vomiting (hyperemesis gravidarum), and acute fatty liver of pregnancy.   Other causes of jaundice that should be ruled out are hepatitis, chronic liver disease (cirrhosis), autoimmune hepatitis, and severe infectious mononucleosis.  This problem is usually harmless. However, it can cause early(premature) birth and threaten infant health. This type of jaundice is similar to jaundice of the newborn. If bilirubin levels become high enough, it can cause mental retardation in the baby. SYMPTOMS   Itching, mild to severe.   Jaundice.   Rash at times.  DIAGNOSIS   Increase amount of bilirubin in the blood stream.   The patient has yellow skin and the white of their eyes (jaundice).  TREATMENT  This problem disappears with delivery of the baby around [redacted] weeks gestation. Treatment is usually directed at  controlling the itching. If the problem is severe and the jaundice reaches dangerous levels, labor may be induced or a cesarean section may be performed. This does not usually cause permanent liver damage in the mother. However, the cholestasis may occur in future pregnancies. HOME CARE INSTRUCTIONS   Do not use drugs or alcohol.   Do not take any medication unless suggested by your caregiver.   Keep all follow-up appointments as directed. This is important for you and your baby's health.   Only use the creams and medications for the itching given to you by your caregiver.  SEEK IMMEDIATE MEDICAL CARE IF:   An unexplained oral temperature above 102 F (38.9 C) develops, or as your caregiver suggests.   Symptoms are not controlled by the medications or measures suggested by your caregiver. You seem to be getting worse.   You develop nausea, vomiting, or abdominal pain.   You develop a severe headache.   You develop vision problems (blurred or double vision).   You develop uterine contractions.   You develop vaginal bleeding.  Document Released: 01/02/2000 Document Revised: 12/24/2010 Document Reviewed: 07/26/2007 [facility] Patient Information 2012 [place], [place].

## 2011-08-04 LAB — GC/CHLAMYDIA PROBE AMP, GENITAL
Chlamydia, DNA Probe: NEGATIVE
GC Probe Amp, Genital: NEGATIVE

## 2011-08-05 ENCOUNTER — Other Ambulatory Visit: Payer: Self-pay | Admitting: Obstetrics & Gynecology

## 2011-08-05 DIAGNOSIS — O26619 Liver and biliary tract disorders in pregnancy, unspecified trimester: Secondary | ICD-10-CM

## 2011-08-06 ENCOUNTER — Ambulatory Visit (HOSPITAL_COMMUNITY)
Admission: RE | Admit: 2011-08-06 | Discharge: 2011-08-06 | Disposition: A | Discharge status: Home / Self Care | Payer: Medicaid Other | Source: Ambulatory Visit | Attending: Obstetrics & Gynecology | Admitting: Obstetrics & Gynecology

## 2011-08-06 VITALS — BP 116/82 | HR 98 | Wt 234.5 lb

## 2011-08-06 DIAGNOSIS — K838 Other specified diseases of biliary tract: Secondary | ICD-10-CM | POA: Insufficient documentation

## 2011-08-06 DIAGNOSIS — K831 Obstruction of bile duct: Secondary | ICD-10-CM

## 2011-08-06 DIAGNOSIS — O0990 Supervision of high risk pregnancy, unspecified, unspecified trimester: Secondary | ICD-10-CM

## 2011-08-06 DIAGNOSIS — O99891 Other specified diseases and conditions complicating pregnancy: Secondary | ICD-10-CM | POA: Insufficient documentation

## 2011-08-06 DIAGNOSIS — Z3689 Encounter for other specified antenatal screening: Secondary | ICD-10-CM | POA: Insufficient documentation

## 2011-08-06 DIAGNOSIS — O26619 Liver and biliary tract disorders in pregnancy, unspecified trimester: Secondary | ICD-10-CM

## 2011-08-06 LAB — CULTURE, BETA STREP (GROUP B ONLY)

## 2011-08-06 NOTE — Progress Notes (Signed)
Patient seen today  for limited ultrasound.  See full report in [facility].  Alpha Gula, MD  Single IUP at 35 4/7 weeks Limited ultrasound performed for AFI only - AFI 20.4 cm Reactive NST  Continue 2x weekly NSTs.  Recommend induction of labor at 37 weeks due to cholestasis of pregnancy.

## 2011-08-08 ENCOUNTER — Inpatient Hospital Stay (HOSPITAL_COMMUNITY): Payer: Medicaid Other | Admitting: Anesthesiology

## 2011-08-08 ENCOUNTER — Encounter (HOSPITAL_COMMUNITY): Payer: Self-pay

## 2011-08-08 ENCOUNTER — Encounter (HOSPITAL_COMMUNITY): Payer: Self-pay | Admitting: Anesthesiology

## 2011-08-08 ENCOUNTER — Inpatient Hospital Stay (HOSPITAL_COMMUNITY)
Admission: AD | Admit: 2011-08-08 | Discharge: 2011-08-11 | DRG: 775 | Disposition: A | Discharge status: Home / Self Care | Payer: Medicaid Other | Source: Ambulatory Visit | Attending: Obstetrics and Gynecology | Admitting: Obstetrics and Gynecology

## 2011-08-08 DIAGNOSIS — O42913 Preterm premature rupture of membranes, unspecified as to length of time between rupture and onset of labor, third trimester: Secondary | ICD-10-CM

## 2011-08-08 DIAGNOSIS — O26619 Liver and biliary tract disorders in pregnancy, unspecified trimester: Secondary | ICD-10-CM | POA: Diagnosis present

## 2011-08-08 DIAGNOSIS — K838 Other specified diseases of biliary tract: Secondary | ICD-10-CM

## 2011-08-08 DIAGNOSIS — O0990 Supervision of high risk pregnancy, unspecified, unspecified trimester: Secondary | ICD-10-CM

## 2011-08-08 DIAGNOSIS — O4290 Premature rupture of membranes, unspecified as to length of time between rupture and onset of labor, unspecified weeks of gestation: Principal | ICD-10-CM | POA: Diagnosis present

## 2011-08-08 DIAGNOSIS — O43899 Other placental disorders, unspecified trimester: Secondary | ICD-10-CM

## 2011-08-08 DIAGNOSIS — R748 Abnormal levels of other serum enzymes: Secondary | ICD-10-CM

## 2011-08-08 HISTORY — DX: Intrahepatic cholestasis of pregnancy, unspecified trimester: O26.649

## 2011-08-08 HISTORY — DX: Obstruction of bile duct: K83.1

## 2011-08-08 HISTORY — DX: Liver and biliary tract disorders in pregnancy, unspecified trimester: O26.619

## 2011-08-08 LAB — CBC
HCT: 35.9 % — ABNORMAL LOW (ref 36.0–46.0)
Hemoglobin: 11.6 g/dL — ABNORMAL LOW (ref 12.0–15.0)
MCH: 27.3 pg (ref 26.0–34.0)
MCHC: 32.3 g/dL (ref 30.0–36.0)
MCV: 84.5 fL (ref 78.0–100.0)

## 2011-08-08 LAB — TYPE AND SCREEN
ABO/RH(D): AB POS
Antibody Screen: NEGATIVE

## 2011-08-08 LAB — COMPREHENSIVE METABOLIC PANEL
BUN: 12 mg/dL (ref 6–23)
CO2: 21 mEq/L (ref 19–32)
Calcium: 10 mg/dL (ref 8.4–10.5)
Creatinine, Ser: 0.59 mg/dL (ref 0.50–1.10)
GFR calc Af Amer: >90 mL/min (ref >90)
GFR calc non Af Amer: >90 mL/min (ref >90)
Glucose, Bld: 101 mg/dL — ABNORMAL HIGH (ref 70–99)

## 2011-08-08 LAB — RPR: RPR Ser Ql: NONREACTIVE

## 2011-08-08 LAB — POCT FERN TEST: Fern Test: POSITIVE

## 2011-08-08 MED ORDER — FENTANYL 2.5 MCG/ML BUPIVACAINE 1/10 % EPIDURAL INFUSION (WH - ANES)
14.0000 mL/h | INTRAMUSCULAR | Status: DC
  Administered 2011-08-08: 14 mL/h via EPIDURAL
  Filled 2011-08-08 (×3): qty 60

## 2011-08-08 MED ORDER — DIPHENHYDRAMINE HCL 50 MG/ML IJ SOLN: 12.5000 mg | INTRAMUSCULAR | Status: DC | PRN

## 2011-08-08 MED ORDER — ACETAMINOPHEN 325 MG PO TABS: 650.0000 mg | ORAL_TABLET | ORAL | Status: DC | PRN

## 2011-08-08 MED ORDER — LIDOCAINE HCL (PF) 1 % IJ SOLN
30.0000 mL | INTRAMUSCULAR | Status: DC | PRN
  Filled 2011-08-08: qty 30

## 2011-08-08 MED ORDER — ZOLPIDEM TARTRATE 5 MG PO TABS: 5.0000 mg | ORAL_TABLET | Freq: Every evening | ORAL | Status: DC | PRN

## 2011-08-08 MED ORDER — HYDROXYZINE HCL 25 MG PO TABS
25.0000 mg | ORAL_TABLET | Freq: Four times a day (QID) | ORAL | Status: DC | PRN
  Filled 2011-08-08: qty 1

## 2011-08-08 MED ORDER — OXYTOCIN 40 UNITS IN LACTATED RINGERS INFUSION - SIMPLE MED
62.5000 mL/h | Freq: Once | INTRAVENOUS | Status: AC
  Administered 2011-08-09: 62.5 mL/h via INTRAVENOUS
  Filled 2011-08-08: qty 1000

## 2011-08-08 MED ORDER — OXYTOCIN 40 UNITS IN LACTATED RINGERS INFUSION - SIMPLE MED
1.0000 m[IU]/min | INTRAVENOUS | Status: DC
  Administered 2011-08-08: 2 m[IU]/min via INTRAVENOUS

## 2011-08-08 MED ORDER — OXYTOCIN BOLUS FROM INFUSION
250.0000 mL | Freq: Once | INTRAVENOUS | Status: DC
  Filled 2011-08-08: qty 500

## 2011-08-08 MED ORDER — OXYCODONE-ACETAMINOPHEN 5-325 MG PO TABS: 1.0000 | ORAL_TABLET | ORAL | Status: DC | PRN

## 2011-08-08 MED ORDER — EPHEDRINE 5 MG/ML INJ: 10.0000 mg | INTRAVENOUS | Status: DC | PRN

## 2011-08-08 MED ORDER — URSODIOL 500 MG PO TABS: 500.0000 mg | ORAL_TABLET | Freq: Two times a day (BID) | ORAL | Status: DC

## 2011-08-08 MED ORDER — CITRIC ACID-SODIUM CITRATE 334-500 MG/5ML PO SOLN: 30.0000 mL | ORAL | Status: DC | PRN

## 2011-08-08 MED ORDER — PHENYLEPHRINE 40 MCG/ML (10ML) SYRINGE FOR IV PUSH (FOR BLOOD PRESSURE SUPPORT)
80.0000 ug | PREFILLED_SYRINGE | INTRAVENOUS | Status: DC | PRN
  Filled 2011-08-08: qty 5

## 2011-08-08 MED ORDER — FENTANYL CITRATE 0.05 MG/ML IJ SOLN
50.0000 ug | INTRAMUSCULAR | Status: DC | PRN
  Administered 2011-08-08 (×2): 50 ug via INTRAVENOUS
  Filled 2011-08-08 (×3): qty 2

## 2011-08-08 MED ORDER — LACTATED RINGERS IV SOLN
INTRAVENOUS | Status: DC
  Administered 2011-08-08 (×2): via INTRAVENOUS

## 2011-08-08 MED ORDER — EPHEDRINE 5 MG/ML INJ
10.0000 mg | INTRAVENOUS | Status: DC | PRN
  Filled 2011-08-08: qty 4

## 2011-08-08 MED ORDER — FLEET ENEMA 7-19 GM/118ML RE ENEM: 1.0000 | ENEMA | RECTAL | Status: DC | PRN

## 2011-08-08 MED ORDER — ONDANSETRON HCL 4 MG/2ML IJ SOLN
4.0000 mg | Freq: Four times a day (QID) | INTRAMUSCULAR | Status: DC | PRN
  Administered 2011-08-09: 4 mg via INTRAVENOUS
  Filled 2011-08-08: qty 2

## 2011-08-08 MED ORDER — LACTATED RINGERS IV SOLN: 500.0000 mL | INTRAVENOUS | Status: DC | PRN

## 2011-08-08 MED ORDER — IBUPROFEN 600 MG PO TABS: 600.0000 mg | ORAL_TABLET | Freq: Four times a day (QID) | ORAL | Status: DC | PRN

## 2011-08-08 MED ORDER — MISOPROSTOL 25 MCG QUARTER TABLET
25.0000 ug | ORAL_TABLET | ORAL | Status: DC | PRN
  Administered 2011-08-08 (×2): 25 ug via VAGINAL
  Filled 2011-08-08: qty 0.25
  Filled 2011-08-08: qty 1
  Filled 2011-08-08: qty 0.25

## 2011-08-08 MED ORDER — LIDOCAINE HCL (PF) 1 % IJ SOLN
INTRAMUSCULAR | Status: DC | PRN
  Administered 2011-08-08 (×2): 5 mL

## 2011-08-08 MED ORDER — PHENYLEPHRINE 40 MCG/ML (10ML) SYRINGE FOR IV PUSH (FOR BLOOD PRESSURE SUPPORT): 80.0000 ug | PREFILLED_SYRINGE | INTRAVENOUS | Status: DC | PRN

## 2011-08-08 MED ORDER — LACTATED RINGERS IV SOLN
500.0000 mL | Freq: Once | INTRAVENOUS | Status: AC
  Administered 2011-08-08: 500 mL via INTRAVENOUS

## 2011-08-08 NOTE — Progress Notes (Addendum)
   Subjective: Pt reports feeling cramping; some sleep after IV pain medications.    Objective: BP 128/80  Pulse 112  Temp 97.9 F (36.6 C) (Oral)  Resp 18  Ht 5' 2.5" (1.588 m)  Wt 107.684 kg (237 lb 6.4 oz)  BMI 42.73 kg/m2  LMP 11/30/2010      FHT:  FHR: 130's bpm, variability: moderate,  accelerations:  Present,  decelerations:  Absent UC:   irregular, every 7-8 minutes SVE:   Dilation: 1.5 Effacement (%): 50 Station: -3 Exam by:: Dr Maisie Fus Reexamine at 10:30 Labs: Lab Results  Component Value Date   WBC 12.0* 08/08/2011   HGB 11.6* 08/08/2011   HCT 35.9* 08/08/2011   MCV 84.5 08/08/2011   PLT 286 08/08/2011    Assessment / Plan: Augmentation of Labor  Preterm Premature Rupture of Membranes  Labor: Augmentation of Labor  Preeclampsia:  n/a Fetal Wellbeing:  Category I Pain Control:  Fentanyl I/D:  n/a Anticipated MOD:  NSVD Recheck cervix at 10:30  [facility] 08/08/2011, 10:02 AM

## 2011-08-08 NOTE — MAU Note (Signed)
Pt states gush of clear fluid about 30 minutes ago and has soaked 2 towels since then. Denies contractions. No vaginal bleeding. Positive fetal movement.

## 2011-08-08 NOTE — Anesthesia Preprocedure Evaluation (Signed)
Anesthesia Evaluation  Patient identified by MRN, date of birth, ID band Patient awake    Reviewed: Allergy & Precautions, H&P , Patient's Chart, lab work & pertinent test results  Airway Mallampati: III TM Distance: >3 FB Neck ROM: full    Dental No notable dental hx.    Pulmonary neg pulmonary ROS,  breath sounds clear to auscultation  Pulmonary exam normal       Cardiovascular negative cardio ROS  Rhythm:regular Rate:Normal     Neuro/Psych  Neuromuscular disease negative neurological ROS  negative psych ROS   GI/Hepatic negative GI ROS, Neg liver ROS,   Endo/Other  negative endocrine ROSMorbid obesity  Renal/GU negative Renal ROS     Musculoskeletal   Abdominal   Peds  Hematology negative hematology ROS (+)   Anesthesia Other Findings Chlamydia     Cholestasis of pregnancy   Reproductive/Obstetrics (+) Pregnancy                           Anesthesia Physical Anesthesia Plan  ASA: III  Anesthesia Plan: Epidural   Post-op Pain Management:    Induction:   Airway Management Planned:   Additional Equipment:   Intra-op Plan:   Post-operative Plan:   Informed Consent: I have reviewed the patients History and Physical, chart, labs and discussed the procedure including the risks, benefits and alternatives for the proposed anesthesia with the patient or authorized representative who has indicated his/her understanding and acceptance.     Plan Discussed with:   Anesthesia Plan Comments:         Anesthesia Quick Evaluation

## 2011-08-08 NOTE — H&P (Signed)
Shatavia Hamil is a 26 y.o. female G2P0010 with intrahepatic cholestasis of pregnancy at 35 weeks and 6 days gestation by early ultrasound presenting for rupture of membranes.  She was asleep and at 0330 today, she felt a large gush of fluid.  She has changed her pad several times and the fluid is still leaking out.  She can feel mild ctx q4-5 minutes, but they are not strong.  She denies vaginal bleeding and reports good fetal movement.  Maternal Medical History:  Reason for admission: Reason for admission: rupture of membranes.  Contractions: Onset was 1-2 hours ago.   Frequency: regular.   Perceived severity is mild.    Fetal activity: Perceived fetal activity is normal.   Last perceived fetal movement was within the past hour.    Prenatal complications: No bleeding, hypertension, placental abnormality, pre-eclampsia or substance abuse.   Prenatal Complications - Diabetes: none. Failed 1-hr GTT but had normal 3-hr GTT  OB History    Grav Para Term Preterm Abortions TAB SAB Ect Mult Living   2 0   1 1  0  0     Past Medical History  Diagnosis Date  . Chlamydia   . Cholestasis of pregnancy    Past Surgical History  Procedure Date  . No past surgeries    Family History: family history includes Cancer in her maternal grandmother and Hypertension in her father. Social History:  reports that she has never smoked. She does not have any smokeless tobacco history on file. She reports that she does not drink alcohol or use illicit drugs.   Prenatal Transfer Tool  Review of Systems  Constitutional: Negative for fever, chills and malaise/fatigue.  Eyes: Negative for blurred vision.  Respiratory: Negative for cough and shortness of breath.   Cardiovascular: Negative for chest pain.  Gastrointestinal: Negative for vomiting and diarrhea.  Genitourinary: Negative for dysuria.  Musculoskeletal: Negative for back pain.  Skin: Positive for itching (occasional).  Neurological: Negative for  dizziness and headaches.    Dilation: 1.5 Effacement (%): 50 Station: -3 Exam by:: Dr. Maisie Fus Blood pressure 137/83, pulse 126, temperature 97.6 F (36.4 C), temperature source Oral, resp. rate 20, height 5' 2.5" (1.588 m), weight 107.684 kg (237 lb 6.4 oz), last menstrual period 11/30/2010.  Maternal Exam:  Uterine Assessment: Contraction strength is mild.  Contraction duration is 45 seconds. Contraction frequency is irregular.   Abdomen: Patient reports no abdominal tenderness. Fetal presentation: vertex Vertex position confirmed by limited bedside ultrasound  Introitus: Normal vulva. Normal vagina.  Ferning test: positive.  Nitrazine test: not done. Amniotic fluid character: clear. Positive pooling  Cervix: Cervix evaluated by sterile speculum exam and digital exam.     Fetal Exam Fetal Monitor Review: Mode: ultrasound.   Baseline rate: 145.  Variability: moderate (6-25 bpm).   Pattern: accelerations present and no decelerations.    Fetal State Assessment: Category I - tracings are normal.     Physical Exam  Constitutional: She is oriented to person, place, and time. She appears well-developed and well-nourished. No distress.  HENT:  Head: Normocephalic and atraumatic.  Eyes: Conjunctivae are normal.  Neck: Normal range of motion. Neck supple.  Cardiovascular: Normal rate, regular rhythm and normal heart sounds.   Respiratory: Effort normal and breath sounds normal.  GI: Soft. Bowel sounds are normal.  Neurological: She is alert and oriented to person, place, and time.  Skin: Skin is warm and dry. She is not diaphoretic.  Psychiatric: She has a normal mood and  affect. Her behavior is normal.    Prenatal labs: ABO, Rh: AB/POS/-- (03/13 1541) Antibody: NEG (03/13 1541) Rubella: 45.6 (03/13 1541) RPR: NON REAC (05/28 0850)  HBsAg: NEGATIVE (07/03 1503)  HIV: NON REACTIVE (03/13 1541)  GBS: Negative (07/16 0000)   Assessment/Plan: G2P0010 at 35 weeks + 6 days  here with SROM but no labor yet.  Will ripen cervix with Cytotec and Foley balloon, then induce with Pitocin if needed.  She is in agreement with the plan and is anxious to get her labor started.  ICP: Continue ursodiol during labor as it may be 1-2 days before delivery.  Case discussed with Philipp Deputy, CNM  Junious Silk S 08/08/2011, 5:21 AM    ADDENDUM: At 0730, a Foley bulb and her first dose of Cytotec were placed.  She tolerated the procedure fairly well and there were no immediate complications.  Her cervical exam was fairly unchanged (~2 cm).  I have seen and examined this patient and I agree with the above. Cam Hai 9:56 AM 08/09/2011

## 2011-08-08 NOTE — Anesthesia Procedure Notes (Signed)
Epidural Patient location during procedure: OB Start time: 08/08/2011 4:26 PM  Staffing Anesthesiologist: Brayton Caves R Performed by: anesthesiologist   Preanesthetic Checklist Completed: patient identified, site marked, surgical consent, pre-op evaluation, timeout performed, IV checked, risks and benefits discussed and monitors and equipment checked  Epidural Patient position: sitting Prep: site prepped and draped and DuraPrep Patient monitoring: continuous pulse ox and blood pressure Approach: midline Injection technique: LOR air and LOR saline  Needle:  Needle type: Tuohy  Needle gauge: 17 G Needle length: 9 cm Needle insertion depth: 8 cm Catheter type: closed end flexible Catheter size: 19 Gauge Catheter at skin depth: 13 cm Test dose: negative  Assessment Events: blood not aspirated, injection not painful, no injection resistance, negative IV test and no paresthesia  Additional Notes Patient identified.  Risk benefits discussed including failed block, incomplete pain control, headache, nerve damage, paralysis, blood pressure changes, nausea, vomiting, reactions to medication both toxic or allergic, and postpartum back pain.  Patient expressed understanding and wished to proceed.  All questions were answered.  Sterile technique used throughout procedure and epidural site dressed with sterile barrier dressing. No paresthesia or other complications noted.The patient did not experience any signs of intravascular injection such as tinnitus or metallic taste in mouth nor signs of intrathecal spread such as rapid motor block. Please see nursing notes for vital signs.

## 2011-08-08 NOTE — Progress Notes (Addendum)
   Subjective: Pt reports increased pain with contractions; rated 7/10.  Reports just receiving additional medication.  No questions or concerns.    Objective: BP 139/78  Pulse 103  Temp 97.9 F (36.6 C) (Oral)  Resp 18  Ht 5' 2.5" (1.588 m)  Wt 107.684 kg (237 lb 6.4 oz)  BMI 42.73 kg/m2  LMP 11/30/2010      FHT:  FHR: 130's bpm, variability: moderate,  accelerations:  Present,  decelerations:  Absent UC:   regular, every 2.5-3.5 minutes SVE:   Dilation: 1.5 Effacement (%): 50 Station: -3 Exam by:: L. McDaniel Rn Foley bulb still in place when tugged  Labs: Lab Results  Component Value Date   WBC 12.0* 08/08/2011   HGB 11.6* 08/08/2011   HCT 35.9* 08/08/2011   MCV 84.5 08/08/2011   PLT 286 08/08/2011    Assessment / Plan: Induction of Labor; Cholestasis of Pregnancy  Labor: Induction of Labor; Cholestasis of Pregnancy Preeclampsia:  n/a Fetal Wellbeing:  Category I Pain Control:  Fentanyl I/D:  n/a Anticipated MOD:  NSVD  [facility] 08/08/2011, 2:48 PM

## 2011-08-09 ENCOUNTER — Encounter (HOSPITAL_COMMUNITY): Payer: Self-pay | Admitting: *Deleted

## 2011-08-09 LAB — CBC
HCT: 33 % — ABNORMAL LOW (ref 36.0–46.0)
MCV: 84.2 fL (ref 78.0–100.0)
Platelets: 240 10*3/uL (ref 150–400)
RBC: 3.92 MIL/uL (ref 3.87–5.11)
RDW: 13.8 % (ref 11.5–15.5)
WBC: 18.3 10*3/uL — ABNORMAL HIGH (ref 4.0–10.5)

## 2011-08-09 MED ORDER — ZOLPIDEM TARTRATE 5 MG PO TABS: 5.0000 mg | ORAL_TABLET | Freq: Every evening | ORAL | Status: DC | PRN

## 2011-08-09 MED ORDER — DIPHENHYDRAMINE HCL 25 MG PO CAPS: 25.0000 mg | ORAL_CAPSULE | Freq: Four times a day (QID) | ORAL | Status: DC | PRN

## 2011-08-09 MED ORDER — SENNOSIDES-DOCUSATE SODIUM 8.6-50 MG PO TABS
2.0000 | ORAL_TABLET | Freq: Every day | ORAL | Status: DC
  Administered 2011-08-09 – 2011-08-10 (×2): 2 via ORAL

## 2011-08-09 MED ORDER — OXYCODONE-ACETAMINOPHEN 5-325 MG PO TABS
1.0000 | ORAL_TABLET | ORAL | Status: DC | PRN
  Administered 2011-08-09: 1 via ORAL
  Filled 2011-08-09: qty 1

## 2011-08-09 MED ORDER — MISOPROSTOL 200 MCG PO TABS
ORAL_TABLET | ORAL | Status: AC
  Filled 2011-08-09: qty 4

## 2011-08-09 MED ORDER — IBUPROFEN 600 MG PO TABS
600.0000 mg | ORAL_TABLET | Freq: Four times a day (QID) | ORAL | Status: DC
  Administered 2011-08-09 – 2011-08-11 (×10): 600 mg via ORAL
  Filled 2011-08-09 (×10): qty 1

## 2011-08-09 MED ORDER — MISOPROSTOL 200 MCG PO TABS
800.0000 ug | ORAL_TABLET | Freq: Once | ORAL | Status: AC
  Administered 2011-08-09: 800 ug via RECTAL

## 2011-08-09 MED ORDER — WITCH HAZEL-GLYCERIN EX PADS
1.0000 | MEDICATED_PAD | CUTANEOUS | Status: DC | PRN
Start: 2011-08-09 — End: 2011-08-11

## 2011-08-09 MED ORDER — ONDANSETRON HCL 4 MG PO TABS: 4.0000 mg | ORAL_TABLET | ORAL | Status: DC | PRN

## 2011-08-09 MED ORDER — BENZOCAINE-MENTHOL 20-0.5 % EX AERO
1.0000 "application " | INHALATION_SPRAY | CUTANEOUS | Status: DC | PRN
  Administered 2011-08-09: 1 via TOPICAL
  Filled 2011-08-09: qty 56

## 2011-08-09 MED ORDER — PRENATAL MULTIVITAMIN CH
1.0000 | ORAL_TABLET | Freq: Every day | ORAL | Status: DC
  Administered 2011-08-09 – 2011-08-10 (×2): 1 via ORAL
  Filled 2011-08-09 (×3): qty 1

## 2011-08-09 MED ORDER — TETANUS-DIPHTH-ACELL PERTUSSIS 5-2.5-18.5 LF-MCG/0.5 IM SUSP
0.5000 mL | Freq: Once | INTRAMUSCULAR | Status: AC
  Administered 2011-08-10: 0.5 mL via INTRAMUSCULAR
  Filled 2011-08-09: qty 0.5

## 2011-08-09 MED ORDER — LANOLIN HYDROUS EX OINT: TOPICAL_OINTMENT | CUTANEOUS | Status: DC | PRN

## 2011-08-09 MED ORDER — SIMETHICONE 80 MG PO CHEW: 80.0000 mg | CHEWABLE_TABLET | ORAL | Status: DC | PRN

## 2011-08-09 MED ORDER — ONDANSETRON HCL 4 MG/2ML IJ SOLN: 4.0000 mg | INTRAMUSCULAR | Status: DC | PRN

## 2011-08-09 MED ORDER — DIBUCAINE 1 % RE OINT: 1.0000 "application " | TOPICAL_OINTMENT | RECTAL | Status: DC | PRN

## 2011-08-09 NOTE — H&P (Signed)
Chart reviewed.  I agree with above Eber Jones L. Harraway-Smith, M.D., Evern Core

## 2011-08-09 NOTE — Anesthesia Postprocedure Evaluation (Signed)
  Anesthesia Post-op Note  Patient: Renee Skinner  Procedure(s) Performed: * No surgery found *  Patient Location: Women's Unit  Anesthesia Type: Epidural  Level of Consciousness: awake  Airway and Oxygen Therapy: Patient Spontanous Breathing  Post-op Pain: none  Post-op Assessment: Patient's Cardiovascular Status Stable, Respiratory Function Stable, Patent Airway, No signs of Nausea or vomiting, Adequate PO intake, Pain level controlled, No headache, No backache, No residual numbness and No residual motor weakness  Post-op Vital Signs: Reviewed and stable  Complications: No apparent anesthesia complications

## 2011-08-10 ENCOUNTER — Encounter: Payer: Medicaid Other | Admitting: Obstetrics & Gynecology

## 2011-08-10 ENCOUNTER — Encounter (HOSPITAL_COMMUNITY): Payer: Self-pay | Admitting: *Deleted

## 2011-08-10 NOTE — Progress Notes (Signed)
UR Chart review completed.  

## 2011-08-10 NOTE — Downtime Event Note (Signed)
The EMR was down for 1630 - 0215 hours on 08/08/11 - 08/09/11.  Jobe Marker, RN was responsible for completing the paper charting during this time period.   The following information was re-entered into the system by Etty Isaac, Gunnar Fusi I: Flowsheet data  The following information will remain in the paper chart: Flowsheet  Khrista Braun, Rulon Abide 08/10/2011

## 2011-08-10 NOTE — Progress Notes (Signed)
Post Partum Day 1  Renee Skinner is a G2 now P1011 who delivered via vaginal delivery at 36 weeks + 0 days gestation after she was admitted with PPROM.  Her pregnancy was complicated by ICP and she was on ursodiol.  She was ruptured for over 24 hours before delivery and the baby was sent to the NICU for breathing difficulty.  Subjective:  Renee Skinner feels well this morning.  She is pumping every 3 hours and is getting more milk out now.  Her baby is still in the NICU but should be discharged from there in 1-2 days.  They are continuing antibiotics on him and waiting for cultures to be negative x 48 hours to stop antibiotics.  Objective: Temp:  [97.5 F (36.4 C)-98.3 F (36.8 C)] 97.5 F (36.4 C) (07/23 0623) Pulse Rate:  [78-94] 78  (07/23 0623) Resp:  [18-20] 18  (07/23 0623) BP: (102-116)/(69-78) 116/78 mmHg (07/23 0623) SpO2:  [97 %-99 %] 99 % (07/23 4010)  Physical Exam:  General: alert, cooperative and no distress Lochia: appropriate Uterine Fundus: firm DVT Evaluation: No evidence of DVT seen on physical exam.   Basename 08/09/11 0520 08/08/11 0600  HGB 10.6* 11.6*  HCT 33.0* 35.9*    Assessment/Plan: Plan for discharge this afternoon or.  She is pumping breast milk and desires Nexplanon for contraception.     LOS: 2 days   Mora Bellman 08/10/2011, 7:37 AM    Patient seen and examined.  Agree with above note.  Levie Heritage, DO 08/10/2011 10:13 AM

## 2011-08-11 MED ORDER — OXYCODONE-ACETAMINOPHEN 5-325 MG PO TABS: 1.0000 | ORAL_TABLET | ORAL | Status: AC | PRN

## 2011-08-11 MED ORDER — IBUPROFEN 600 MG PO TABS: 600.0000 mg | ORAL_TABLET | Freq: Four times a day (QID) | ORAL | Status: AC | PRN

## 2011-08-11 MED ORDER — SENNOSIDES-DOCUSATE SODIUM 8.6-50 MG PO TABS: 1.0000 | ORAL_TABLET | Freq: Two times a day (BID) | ORAL | Status: AC | PRN

## 2011-08-11 MED ORDER — BENZOCAINE-MENTHOL 20-0.5 % EX AERO: 1.0000 "application " | INHALATION_SPRAY | CUTANEOUS | Status: DC | PRN

## 2011-08-11 NOTE — Progress Notes (Signed)
Discharge instructions reviewed with patient.  Patient able to " Teach back" home care and signs/symptoms to report to MD.  No home equipment needed.  Ambulated to car with staff and discharged to care of significant other.

## 2011-08-11 NOTE — Discharge Summary (Signed)
Obstetric Discharge Summary Reason for Admission: rupture of membranes Prenatal Procedures: none Intrapartum Procedures: spontaneous vaginal delivery Postpartum Procedures: none Complications-Operative and Postpartum: 1st degree perineal laceration  Contraception: Nexplanon Mom is breastfeeding (pumping)  Physical Exam:  Temp:  [97.8 F (36.6 C)-98.8 F (37.1 C)] 97.8 F (36.6 C) (07/24 0655) Pulse Rate:  [80-99] 80  (07/24 0655) Resp:  [18] 18  (07/24 0655) BP: (117-120)/(80-83) 117/80 mmHg (07/24 0655) SpO2:  [98 %-99 %] 99 % (07/24 0655)  General: alert, cooperative, appears stated age and no distress Lochia: appropriate Uterine Fundus: firm   DVT Evaluation: No evidence of DVT seen on physical exam.  Discharge Diagnoses: Preterm delivery  Discharge Information: Date: 08/11/2011 Activity: pelvic rest Diet: routine Medications: Ibuprofen, Colace and Percocet Condition: stable Instructions: Refer to discharge instructions Discharge to: home Follow-up Information    Follow up with [facility] . (As scheduled in Sept for your postpartum)    Contact information:   [address] [place] [address]          Newborn Data: Live born female  Birth Weight: 6 lb 11.9 oz (3060 g) APGAR: 8, 9 Baby in NICU for 72 hours of observation/antibiotics given respiratory distress after birth. Not on O2, feeding well.  Junious Silk S 08/11/2011, 7:44 AM  I have seen this patient and agree with the above resident's note.  Discussed rental of breast pump with pt and answered breastfeeding questions.  LEFTWICH-KIRBY, Eber Ferrufino Certified Nurse-Midwife

## 2011-08-11 NOTE — Discharge Summary (Signed)
Attestation of Attending Supervision of Advanced Practitioner (CNM/NP): Evaluation and management procedures were performed by the Advanced Practitioner under my supervision and collaboration.  I have reviewed the Advanced Practitioner's note and chart, and I agree with the management and plan.  Jaynie Collins, M.D. 08/11/2011 1:16 PM

## 2011-08-13 ENCOUNTER — Inpatient Hospital Stay (HOSPITAL_COMMUNITY): Admission: RE | Admit: 2011-08-13 | Payer: Medicaid Other | Source: Ambulatory Visit

## 2011-08-13 ENCOUNTER — Other Ambulatory Visit (HOSPITAL_COMMUNITY): Payer: Medicaid Other

## 2011-08-16 ENCOUNTER — Inpatient Hospital Stay (HOSPITAL_COMMUNITY): Admission: RE | Admit: 2011-08-16 | Payer: Medicaid Other | Source: Ambulatory Visit

## 2011-09-21 ENCOUNTER — Ambulatory Visit: Payer: Medicaid Other | Admitting: Obstetrics & Gynecology

## 2011-09-23 ENCOUNTER — Ambulatory Visit: Payer: Medicaid Other | Admitting: Obstetrics & Gynecology

## 2011-11-03 ENCOUNTER — Encounter: Payer: Self-pay | Admitting: Obstetrics and Gynecology

## 2011-11-03 ENCOUNTER — Ambulatory Visit (INDEPENDENT_AMBULATORY_CARE_PROVIDER_SITE_OTHER): Payer: Medicaid Other | Admitting: Obstetrics and Gynecology

## 2011-11-03 NOTE — Progress Notes (Signed)
  Subjective:     Renee Skinner is a 26 y.o. female who presents for a postpartum visit. She is 8 weeks postpartum following a spontaneous vaginal delivery. I have fully reviewed the prenatal and intrapartum course. The delivery was at 36 gestational weeks. Outcome: spontaneous vaginal delivery. Anesthesia: epidural. Postpartum course has been uncomplicated. Baby's course has been uncomplicated. Baby is feeding by both breast and bottle - Similac Advance. Bleeding no bleeding. Bowel function is normal. Bladder function is normal. Patient is sexually active. Contraception method is none. Postpartum depression screening: negative.     Review of Systems A comprehensive review of systems was negative.   Objective:    BP 115/81  Pulse 83  Ht 5\' 2"  (1.575 m)  Wt 214 lb (97.07 kg)  BMI 39.14 kg/m2  LMP 10/17/2011  Breastfeeding? No  General:  alert, cooperative and no distress   Breasts:  inspection negative, no nipple discharge or bleeding, no masses or nodularity palpable  Lungs: clear to auscultation bilaterally  Heart:  S1, S2 normal  Abdomen: soft, non-tender; bowel sounds normal; no masses,  no organomegaly   Vulva:  normal  Vagina: normal vagina, no discharge, exudate, lesion, or erythema  Cervix:  no cervical motion tenderness and no lesions  Corpus: normal size, contour, position, consistency, mobility, non-tender  Adnexa:  normal adnexa and no mass, fullness, tenderness  Rectal Exam: Not performed.        Assessment:    Normal postpartum exam. Pap smear not done at today's visit.   Plan:    1. Contraception: Nexplanon 2. Patient medically cleared to resume all activities of daily living 3. Follow up in: 1 week or as needed for Nexplanon insertion.Patient advised to abstain from intercourse until nexplanon insertion.

## 2011-11-08 ENCOUNTER — Ambulatory Visit (INDEPENDENT_AMBULATORY_CARE_PROVIDER_SITE_OTHER): Payer: Medicaid Other | Admitting: Obstetrics & Gynecology

## 2011-11-08 ENCOUNTER — Encounter: Payer: Self-pay | Admitting: Obstetrics & Gynecology

## 2011-11-08 VITALS — BP 124/77 | HR 97 | Ht 62.0 in | Wt 212.0 lb

## 2011-11-08 DIAGNOSIS — Z01812 Encounter for preprocedural laboratory examination: Secondary | ICD-10-CM

## 2011-11-08 DIAGNOSIS — Z30017 Encounter for initial prescription of implantable subdermal contraceptive: Secondary | ICD-10-CM

## 2011-11-08 NOTE — Patient Instructions (Signed)

## 2011-11-08 NOTE — Progress Notes (Signed)
Renee Skinner is a 26 y.o. [phone] here for Nexplanon insertion. She is 3 months postpartum, no GYN concerns.  Last pap smear was on 04/19/2011 and was normal. She has used Implanon in the past without any problems.   Nexplanon Insertion Procedure Patient given informed consent, she signed consent form. Pregnancy test was negative.  Appropriate time out taken.  Patient's left arm was prepped and draped in the usual sterile fashion.. The ruler used to measure and mark insertion area.  Patient was prepped with alcohol swab and then injected with 5 ml of 1 % lidocaine.  She was prepped with betadine, Nexplanon removed from packaging,  Device confirmed in needle, then inserted full length of needle and withdrawn per handbook instructions. Nexplanon was able to palpated in the patient's arm; patient palpated the insert herself.  There was minimal blood loss.  Patient insertion site covered with guaze and a pressure bandage to reduce any bruising.  The patient tolerated the procedure well and was given post procedure instructions.   Return in about one month for Nexplanon check.

## 2011-11-22 ENCOUNTER — Ambulatory Visit (INDEPENDENT_AMBULATORY_CARE_PROVIDER_SITE_OTHER): Payer: Medicaid Other | Admitting: Obstetrics & Gynecology

## 2011-11-22 ENCOUNTER — Encounter: Payer: Self-pay | Admitting: Obstetrics & Gynecology

## 2011-11-22 VITALS — BP 111/84 | HR 87 | Ht 62.0 in | Wt 213.0 lb

## 2011-11-22 DIAGNOSIS — Z3046 Encounter for surveillance of implantable subdermal contraceptive: Secondary | ICD-10-CM

## 2011-11-22 DIAGNOSIS — Z3201 Encounter for pregnancy test, result positive: Secondary | ICD-10-CM

## 2011-11-22 DIAGNOSIS — Z3480 Encounter for supervision of other normal pregnancy, unspecified trimester: Secondary | ICD-10-CM | POA: Insufficient documentation

## 2011-11-22 DIAGNOSIS — N912 Amenorrhea, unspecified: Secondary | ICD-10-CM

## 2011-11-22 NOTE — Patient Instructions (Signed)
Return to clinic for any obstetric concerns or go to MAU for evaluation  

## 2011-11-22 NOTE — Progress Notes (Signed)
Renee Skinner is a 26 y.o. [phone] at [redacted]w[redacted]d here for Nexplanon removal.  She had it placed on 11/08/11, when she was 3 months postpartum, and had a negative pregnancy test that day. She just found out she is currently pregnant, LMP 10/17/11.  She wants the Nexplanon removed. No other concerns.  Last pap smear was on 04/19/2011 and was normal.   Nexplanon Removal  Procedure Patient given informed consent for removal of her Nexplanon.  Appropriate time out taken. Nexplanon site identified.  Area prepped in usual sterile fashon. 3 ml of 1% lidocaine was used to anesthetize the area at the distal end of the implant. A small stab incision was made right beside the implant on the distal portion.  The Nexplanon rod was grasped using hemostats and removed without difficulty.  There was minimal blood loss. There were no complications.  A small amount of antibiotic ointment and steri-strips were applied over the small incision.  A pressure bandage was applied to reduce any bruising.  The patient tolerated the procedure well and was given post procedure instructions.  She will return in about 4 weeks to initiate prenatal care. Routine prenatal instructions given.

## 2011-12-23 ENCOUNTER — Encounter: Payer: Medicaid Other | Admitting: Obstetrics & Gynecology

## 2011-12-23 DIAGNOSIS — Z3480 Encounter for supervision of other normal pregnancy, unspecified trimester: Secondary | ICD-10-CM

## 2012-12-03 ENCOUNTER — Emergency Department (HOSPITAL_COMMUNITY)
Admission: EM | Admit: 2012-12-03 | Discharge: 2012-12-04 | Disposition: A | Discharge status: Home / Self Care | Payer: Self-pay | Attending: Emergency Medicine | Admitting: Emergency Medicine

## 2012-12-03 ENCOUNTER — Encounter (HOSPITAL_COMMUNITY): Payer: Self-pay | Admitting: Emergency Medicine

## 2012-12-03 DIAGNOSIS — N390 Urinary tract infection, site not specified: Secondary | ICD-10-CM | POA: Insufficient documentation

## 2012-12-03 DIAGNOSIS — Z8619 Personal history of other infectious and parasitic diseases: Secondary | ICD-10-CM | POA: Insufficient documentation

## 2012-12-03 DIAGNOSIS — K8050 Calculus of bile duct without cholangitis or cholecystitis without obstruction: Secondary | ICD-10-CM

## 2012-12-03 DIAGNOSIS — K8020 Calculus of gallbladder without cholecystitis without obstruction: Secondary | ICD-10-CM | POA: Insufficient documentation

## 2012-12-03 DIAGNOSIS — Z3202 Encounter for pregnancy test, result negative: Secondary | ICD-10-CM | POA: Insufficient documentation

## 2012-12-03 NOTE — ED Provider Notes (Signed)
CSN: [id number]     Arrival date & time 12/03/12  2131 History   First MD Initiated Contact with Patient 12/03/12 2256     Chief Complaint  Patient presents with  . Abdominal Pain   (Consider location/radiation/quality/duration/timing/severity/associated sxs/prior Treatment) HPI  Renee Skinner is a 27 y.o. female  complaining of diffuse sharp abdominal pain that started around 7pm.  She had cheese burger in dinner at restaurant where she never went. She had one non bloody vomiting after dinner around 6:30pm and then pain started. She denies any vomiting or nausea afterwards. She states that lying makes pain worse and sitting makes better. She never had similar pain in past. She denies diarrhea, fever, chills, CP, heartburn, vaginal discharge, urinary frequency or urgency. She denies hx of abdominal pain after fatty meal.  Past Medical History  Diagnosis Date  . Chlamydia   . Cholestasis of pregnancy    Past Surgical History  Procedure Laterality Date  . No past surgeries     Family History  Problem Relation Age of Onset  . Hypertension Father   . Cancer Maternal Grandmother     Breast/tn  72 when she passed  . Other Neg Hx    History  Substance Use Topics  . Smoking status: Never Smoker   . Smokeless tobacco: Never Used  . Alcohol Use: No   OB History   Grav Para Term Preterm Abortions TAB SAB Ect Mult Living   2 1  1 1 1   0  1     Review of Systems 10 systems reviewed and found to be negative, except as noted in the HPI  Allergies  Review of patient's allergies indicates no known allergies.  Home Medications   Current Outpatient Rx  Name  Route  Sig  Dispense  Refill  . diphenhydrAMINE (BENADRYL) 25 MG tablet   Oral   Take 25 mg by mouth every 8 (eight) hours as needed for allergies.          BP 114/73  Pulse 90  Temp(Src) 98.5 F (36.9 C) (Oral)  Resp 20  SpO2 100%  LMP 11/17/2012 Physical Exam  Nursing note and vitals reviewed. Constitutional: She  is oriented to person, place, and time. She appears well-developed and well-nourished. No distress.  HENT:  Head: Normocephalic and atraumatic.  Mouth/Throat: Oropharynx is clear and moist.  Eyes: Conjunctivae and EOM are normal. Pupils are equal, round, and reactive to light.  Neck: Normal range of motion.  Cardiovascular: Normal rate, regular rhythm and intact distal pulses.   Pulmonary/Chest: Effort normal and breath sounds normal. No stridor. No respiratory distress. She has no wheezes. She has no rales. She exhibits no tenderness.  Abdominal: Soft. Bowel sounds are normal. She exhibits no distension and no mass. There is tenderness. There is no rebound and no guarding.  Positive Murphy's sign, no guarding or rebound.  Musculoskeletal: Normal range of motion.  Neurological: She is alert and oriented to person, place, and time.  Psychiatric: She has a normal mood and affect.    ED Course  Procedures (including critical care time) Labs Review Labs Reviewed  URINALYSIS, ROUTINE W REFLEX MICROSCOPIC - Abnormal; Notable for the following:    APPearance CLOUDY (*)    Leukocytes, UA LARGE (*)    All other components within normal limits  CBC WITH DIFFERENTIAL - Abnormal; Notable for the following:    WBC 11.7 (*)    Hemoglobin 11.5 (*)    HCT 35.2 (*)  Platelets 403 (*)    Lymphs Abs 4.9 (*)    All other components within normal limits  BASIC METABOLIC PANEL - Abnormal; Notable for the following:    GFR calc non Af Amer 90 (*)    All other components within normal limits  HEPATIC FUNCTION PANEL - Abnormal; Notable for the following:    Total Protein 8.4 (*)    Total Bilirubin 0.1 (*)    All other components within normal limits  URINE MICROSCOPIC-ADD ON - Abnormal; Notable for the following:    Squamous Epithelial / LPF MANY (*)    Bacteria, UA MANY (*)    All other components within normal limits  URINE CULTURE  LIPASE, BLOOD  POCT PREGNANCY, URINE   Imaging Review No  results found.  EKG Interpretation   None       MDM  No diagnosis found.   Filed Vitals:   12/03/12 2204 12/04/12 0002  BP: 118/73 114/73  Pulse: 82 90  Temp: 98.1 F (36.7 C) 98.5 F (36.9 C)  TempSrc: Oral Oral  Resp: 20 20  SpO2: 100% 100%     Renee Skinner is a 27 y.o. female complaining of single episode of nonbloody, nonbilious, coffee-ground emesis onset at 7 PM tonight associated with a right upper quadrant pain. Patient has a positive Murphy sign and evaluation. Urinalysis is consistent with infection, I will give her Rocephin via IV. Pt Signed out to PA Harris at shift change. Plan is to follow up ultrasound to rule out cholecystitis, if this is negative, patient should be given a by mouth challenge and DC'd home with nausea and pain control medication, in addition to treatment for her urinary tract infection   Medications  cefTRIAXone (ROCEPHIN) 1 g in dextrose 5 % 50 mL IVPB (not administered)    Note: Portions of this report may have been transcribed using voice recognition software. Every effort was made to ensure accuracy; however, inadvertent computerized transcription errors may be present      Wynetta Emery, PA-C 12/04/12 1922

## 2012-12-03 NOTE — ED Notes (Signed)
Per pt, n/v and abdominal pain since this afternoon.  Started at around 7 pm.  No fever.  No difficulty urinating.

## 2012-12-04 ENCOUNTER — Emergency Department (HOSPITAL_COMMUNITY): Payer: Self-pay

## 2012-12-04 LAB — HEPATIC FUNCTION PANEL
Alkaline Phosphatase: 107 U/L (ref 39–117)
Bilirubin, Direct: <0.1 mg/dL (ref 0.0–0.3)
Total Bilirubin: 0.1 mg/dL — ABNORMAL LOW (ref 0.3–1.2)
Total Protein: 8.4 g/dL — ABNORMAL HIGH (ref 6.0–8.3)

## 2012-12-04 LAB — URINE MICROSCOPIC-ADD ON

## 2012-12-04 LAB — CBC WITH DIFFERENTIAL/PLATELET
Basophils Absolute: 0 10*3/uL (ref 0.0–0.1)
Eosinophils Relative: 2 % (ref 0–5)
HCT: 35.2 % — ABNORMAL LOW (ref 36.0–46.0)
Lymphocytes Relative: 42 % (ref 12–46)
MCV: 79.5 fL (ref 78.0–100.0)
Monocytes Absolute: 0.8 10*3/uL (ref 0.1–1.0)
Monocytes Relative: 7 % (ref 3–12)
RDW: 15.3 % (ref 11.5–15.5)
WBC: 11.7 10*3/uL — ABNORMAL HIGH (ref 4.0–10.5)

## 2012-12-04 LAB — BASIC METABOLIC PANEL
BUN: 17 mg/dL (ref 6–23)
CO2: 25 mEq/L (ref 19–32)
Calcium: 9.6 mg/dL (ref 8.4–10.5)
Creatinine, Ser: 0.88 mg/dL (ref 0.50–1.10)
Glucose, Bld: 97 mg/dL (ref 70–99)

## 2012-12-04 LAB — URINALYSIS, ROUTINE W REFLEX MICROSCOPIC
Glucose, UA: NEGATIVE mg/dL
Hgb urine dipstick: NEGATIVE
Ketones, ur: NEGATIVE mg/dL
Protein, ur: NEGATIVE mg/dL

## 2012-12-04 LAB — LIPASE, BLOOD: Lipase: 21 U/L (ref 11–59)

## 2012-12-04 MED ORDER — SULFAMETHOXAZOLE-TRIMETHOPRIM 800-160 MG PO TABS: 1.0000 | ORAL_TABLET | Freq: Two times a day (BID) | ORAL | Status: DC

## 2012-12-04 MED ORDER — HYDROCODONE-ACETAMINOPHEN 5-325 MG PO TABS: 1.0000 | ORAL_TABLET | Freq: Four times a day (QID) | ORAL | Status: DC | PRN

## 2012-12-04 MED ORDER — ONDANSETRON HCL 4 MG PO TABS: 4.0000 mg | ORAL_TABLET | Freq: Four times a day (QID) | ORAL | Status: DC

## 2012-12-04 MED ORDER — CEFTRIAXONE SODIUM 1 G IJ SOLR
1.0000 g | Freq: Once | INTRAMUSCULAR | Status: AC
  Administered 2012-12-04: 1 g via INTRAVENOUS
  Filled 2012-12-04: qty 10

## 2012-12-04 NOTE — ED Notes (Signed)
Ultrasound at bedside. IV to be inserted for antibiotic when ultrasound complete.

## 2012-12-04 NOTE — ED Provider Notes (Signed)
2:32 AM BP 114/73  Pulse 90  Temp(Src) 98.5 F (36.9 C) (Oral)  Resp 20  SpO2 100%  LMP 11/17/2012 Patient taken in hand-off form PA Pisciotta at 1:00 PM. Patient here for evaluation of RUQ abdominal pain and vomiting after eating a hamburger. Her pain has decreased sig. And no vomiting.  Labs show UTI. Slight leukocytosis and anemia.  Patient states that she is feeling much better at this point.  Her ultrasound shows cholelithiasis with a 1.1 cm stone lodged in the neck of the gallbladder.  There is no sign of gallbladder wall thickening or fluid.   CV: RRR, No M/R/G, Peripheral pulses intact. No peripheral edema. Lungs: CTAB Abd: Soft,  non distended mild tenderness to the right upper quadrant Discussed the findings with the patient. Do not feel she needs surgical consult at this time the patient is tolerating fluids. Will d/c with pain meds, antiemetics and abx for UTI. Patient is to f/u with surgery.   Arthor Captain, PA-C 12/04/12 1447

## 2012-12-05 LAB — URINE CULTURE: Culture: NO GROWTH

## 2012-12-06 NOTE — ED Provider Notes (Signed)
Medical screening examination/treatment/procedure(s) were performed by non-physician practitioner and as supervising physician I was immediately available for consultation/collaboration.  EKG Interpretation   None         Hanley Seamen, MD 12/06/12 2241

## 2012-12-06 NOTE — ED Provider Notes (Signed)
Medical screening examination/treatment/procedure(s) were performed by non-physician practitioner and as supervising physician I was immediately available for consultation/collaboration.  EKG Interpretation   None         Cadee Agro L Katerin Negrete, MD 12/06/12 2241 

## 2012-12-10 IMAGING — US US OB DETAIL+14 WK
2 series · 12 of 28 positions shown · non-contrast
Comparison: none

[Series 1: us ob detail +14 wk · 10 of 88 slices shown (1 of 2)]
[im 4/88]
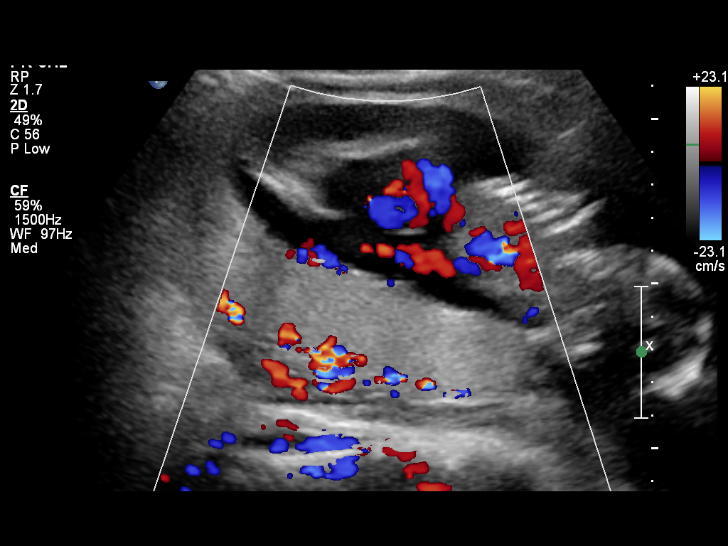
[im 12/88]
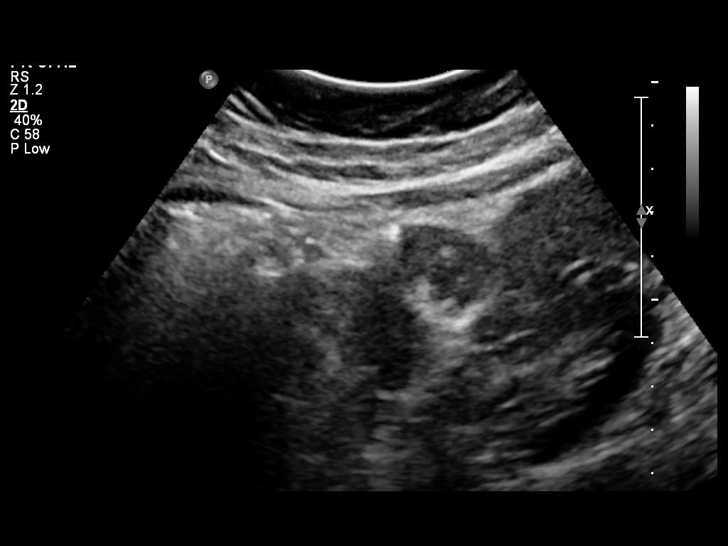
[im 20/88]
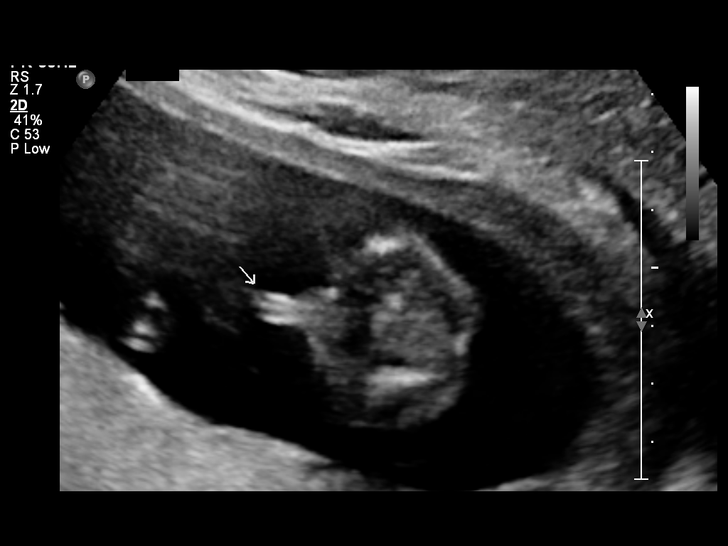
[im 32/88]
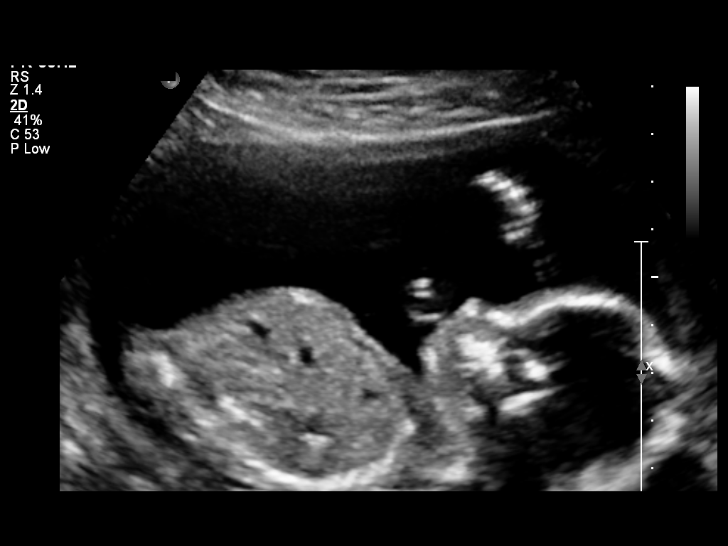
[im 40/88]
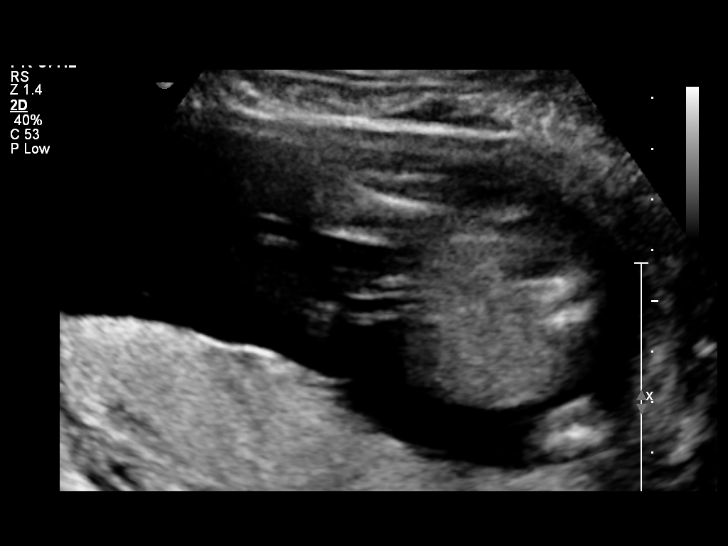
[im 48/88]
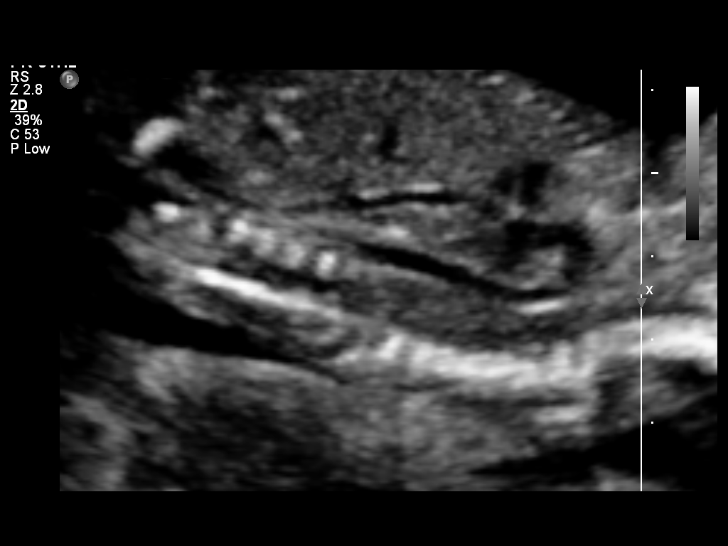
[im 60/88]
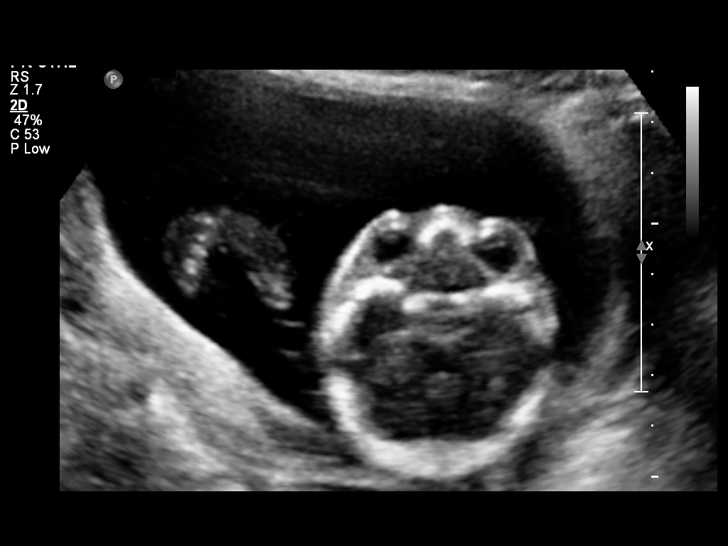
[im 68/88]
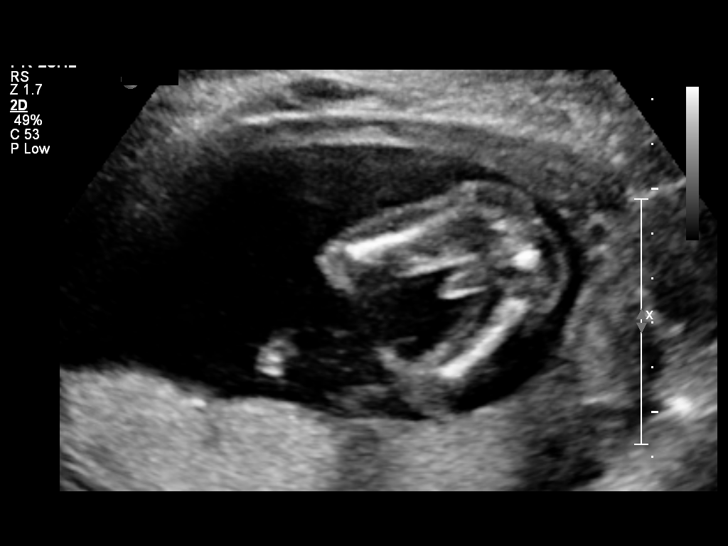
[im 76/88]
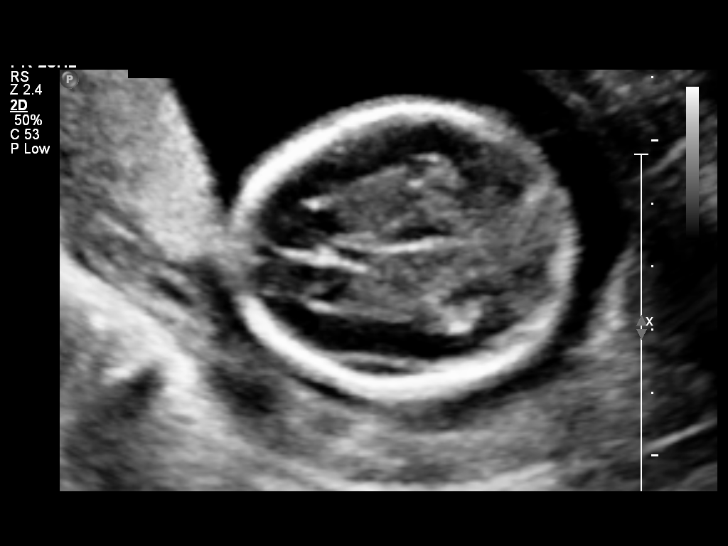
[im 88/88]
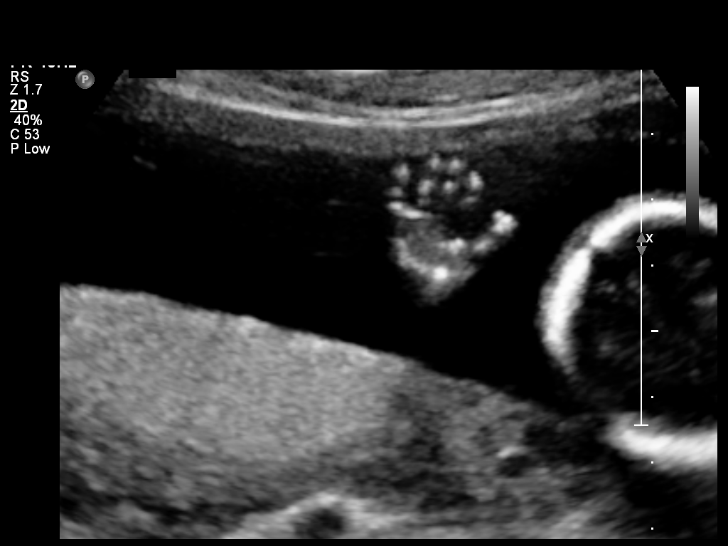

[Series 1: us ob detail +14 wk · 2 of 20 slices shown (2 of 2)]
[im 5/20]
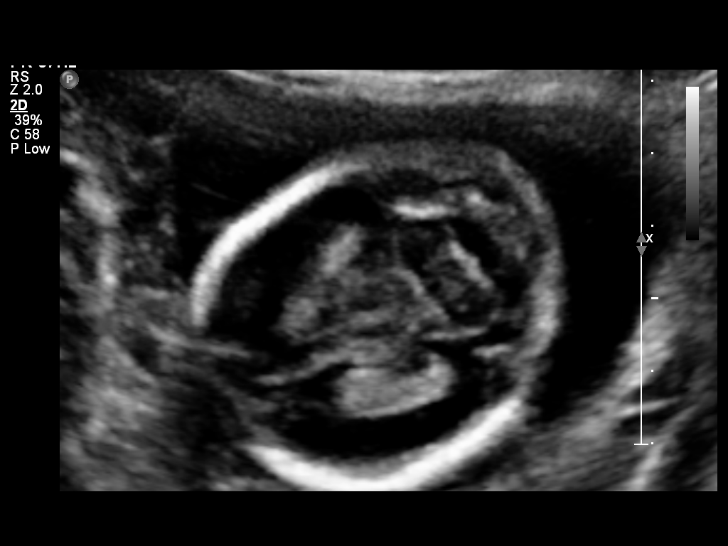
[im 15/20]
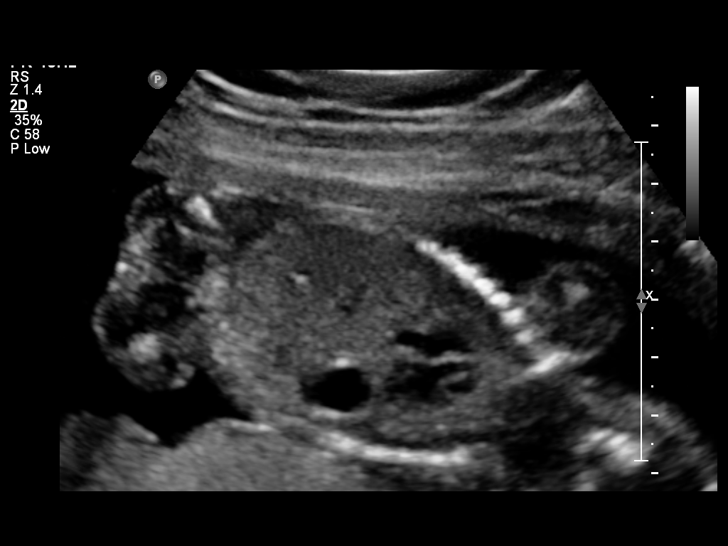

[12 of 28 positions shown; findings below may reference images not displayed]

OBSTETRICS REPORT
                      (Signed Final 04/07/2011 [DATE])

 Order#:         [id number]
Procedures

 US OB DETAIL + 14 WK                                  76811.0
Indications

 Detailed fetal anatomic survey
Fetal Evaluation

 Fetal Heart Rate:  155                         bpm
 Cardiac Activity:  Observed
 Presentation:      Cephalic
 Placenta:          Posterior, above cervical
                    os
 P. Cord            Visualized
 Insertion:

 Amniotic Fluid
 AFI FV:      Subjectively within normal limits
                                             Larg Pckt:     4.1  cm
Biometry

 BPD:       43  mm    G. Age:   19w 0d                CI:        73.15   70 - 86
                                                      FL/HC:      17.0   15.8 -
                                                                         18
 HC:     159.8  mm    G. Age:   18w 6d       68  %    HC/AC:      1.14   1.07 -

 AC:     139.6  mm    G. Age:   19w 3d       80  %    FL/BPD:
 FL:      27.1  mm    G. Age:   18w 2d       44  %    FL/AC:      19.4   20 - 24
 HUM:     24.9  mm    G. Age:   17w 6d       39  %
 NFT:      3.1  mm

 Est. FW:     261  gm      0 lb 9 oz     56  %
Gestational Age

 LMP:           18w 2d       Date:   11/30/10                 EDD:   09/06/11
 U/S Today:     18w 6d                                        EDD:   09/02/11
 Best:          18w 2d    Det. By:   LMP  (11/30/10)          EDD:   09/06/11
Anatomy

 Cranium:           Appears normal      Aortic Arch:       Appears normal
 Fetal Cavum:       Appears normal      Ductal Arch:       Appears normal
 Ventricles:        Appears normal      Diaphragm:         Appears normal
 Choroid Plexus:    Appears normal      Stomach:           Appears
                                                           normal, left
                                                           sided
 Cerebellum:        Appears normal      Abdomen:           Appears normal
 Posterior Fossa:   Appears normal      Abdominal Wall:    Appears nml
                                                           (cord insert,
                                                           abd wall)
 Nuchal Fold:       Appears normal      Cord Vessels:      Appears normal
                    (neck, nuchal                          (3 vessel cord)
                    fold)
 Face:              Orbits and          Kidneys:           Appear normal
                    profile appear
                    normal
 Heart:             Appears normal      Bladder:           Appears normal
                    (4 chamber &
                    axis)
 RVOT:              Appears normal      Spine:             Appears normal
 LVOT:              Appears normal      Limbs:             Appears normal
                                                           (hands, ankles,
                                                           feet)

 Other:     Heels visualized. Lt 5th visulaized. Nasal bone
            visualized. Fetus appears to be a male. Technically
            difficult due to fetal position.
Cervix Uterus Adnexa

 Cervical Length:   3.29      cm

 Cervix:       Normal appearance by transabdominal scan.
 Left Ovary:   Within normal limits.
 Right Ovary:  Within normal limits.

 Adnexa:     No abnormality visualized.
Impression

 Single living IUP. EGA by ultrasound is concordant with
 assigned GA.
 The visualized fetal anatomy appears normal.
 Normal amniotic fluid volume and cervical length.

 questions or concerns.

## 2012-12-20 ENCOUNTER — Ambulatory Visit (INDEPENDENT_AMBULATORY_CARE_PROVIDER_SITE_OTHER): Payer: Medicaid Other | Admitting: Physician Assistant

## 2012-12-20 ENCOUNTER — Encounter: Payer: Self-pay | Admitting: Physician Assistant

## 2012-12-20 VITALS — BP 133/64 | HR 105 | Ht 62.5 in | Wt 212.0 lb

## 2012-12-20 DIAGNOSIS — K8020 Calculus of gallbladder without cholecystitis without obstruction: Secondary | ICD-10-CM

## 2012-12-20 DIAGNOSIS — R4589 Other symptoms and signs involving emotional state: Secondary | ICD-10-CM

## 2012-12-20 DIAGNOSIS — F411 Generalized anxiety disorder: Secondary | ICD-10-CM

## 2012-12-20 MED ORDER — ONDANSETRON HCL 4 MG PO TABS: 4.0000 mg | ORAL_TABLET | Freq: Four times a day (QID) | ORAL | Status: DC

## 2012-12-20 MED ORDER — HYDROCODONE-ACETAMINOPHEN 5-325 MG PO TABS: 1.0000 | ORAL_TABLET | Freq: Four times a day (QID) | ORAL | Status: DC | PRN

## 2012-12-20 MED ORDER — CLONAZEPAM 0.5 MG PO TABS: 0.5000 mg | ORAL_TABLET | Freq: Two times a day (BID) | ORAL | Status: DC | PRN

## 2012-12-20 NOTE — Patient Instructions (Addendum)
Make appt for surgery.   Stay away from fatty foods.

## 2012-12-20 NOTE — Progress Notes (Signed)
   Subjective:    Patient ID: Renee Skinner, female    DOB: [birth year], 27 y.o.   MRN: [id number]  HPI  Patient is a 27 yo female who presents to the clinic to establish care. Pt is being seen because she was at the ER on 11/16 for symptomatic cholelithasis via ultrasound. Pt does not have any insurance and cannot afford the surgery as of this moment. She has spoken with the surgeon in [place] and is aware that she needs to have $250 in order to be seen. Symptoms are not consistently all the time. Sharp right upper quadrant pain and nausea are off and on but in relationship to food. She has not been vomiting or running a fever. On 11/16 pain was 10 out of 10 however it has not been as bad since. This whole situation has stress patient how. She has not been able to go to work. She has been taking Norco and Zofran regularly. She is to do a lot of traveling for work and not able to drive with Lortab in her system.   Review of Systems  All other systems reviewed and are negative.       Objective:   Physical Exam  Constitutional: She is oriented to person, place, and time. She appears well-developed and well-nourished.  HENT:  Head: Normocephalic and atraumatic.  Cardiovascular: Normal rate, regular rhythm and normal heart sounds.   Pulmonary/Chest: Effort normal and breath sounds normal.  Abdominal: Soft. Bowel sounds are normal.  Right upper quadrant tender to palpation. Positive Murphy sign.  Neurological: She is alert and oriented to person, place, and time.  Skin: Skin is warm and dry.  Psychiatric: She has a normal mood and affect. Her behavior is normal.          Assessment & Plan:  Symptomatic cholecystitis-discuss with patient that the only remedy to this problem his surgery. I encouraged her that that [facility] still pain plan. I did refill her Norco and Zofran today. Discussed how diet changes can help with pain control. If she develops severe symptoms such as nausea  vomiting fever she does need to go back to the emergency room. I did fill out short-term disability paperwork for patient to be out of work until she has surgery. I do not think patient is able to work under the influence of Norco regularly for pain. I explained to the patient that I cannot continue right her out for short-term disability if she did not have a surgery date or plan to get surgery. Patient is planning to call the [facility] that she was referred to ASAP.  Acute stress do to situation- PHQ-9 was 16, GAD-7 was 18.patient has not historically had problems with anxiety and depression. I did give Klonopin to use as needed for acute stressful situations. Patient that I do not want to have to continue giving this medication only to be used during this time of stress. I mentioned starting a long-term medication but patient declined at this time.

## 2013-04-03 IMAGING — US US OB LIMITED
1 series · 9 of 9 positions shown · non-contrast
Comparison: none

[Series 1: us ob limited · 0.26mm/px · 9 of 9 slices shown]
[im 1/9]
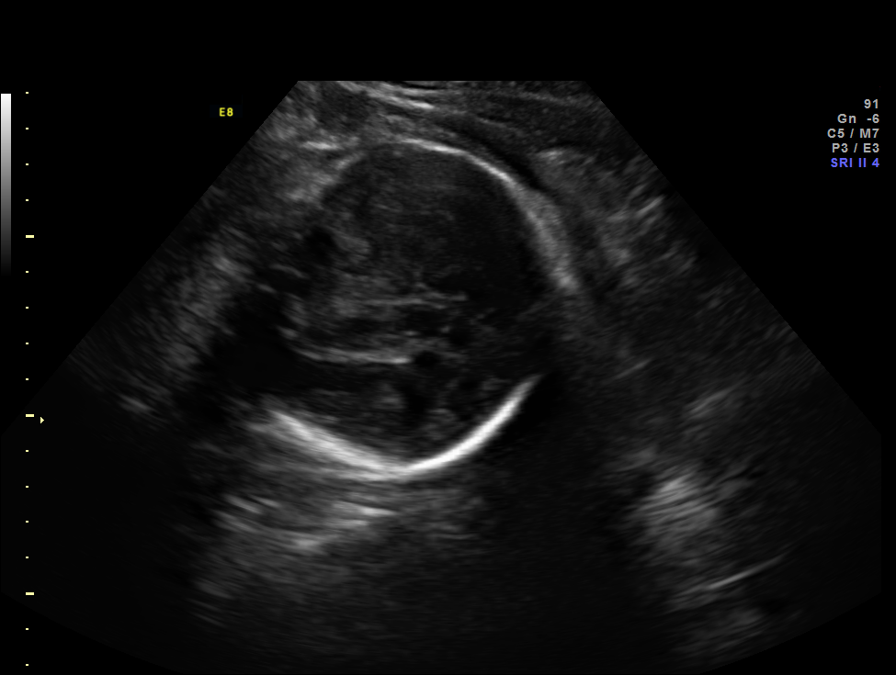
[im 2/9]
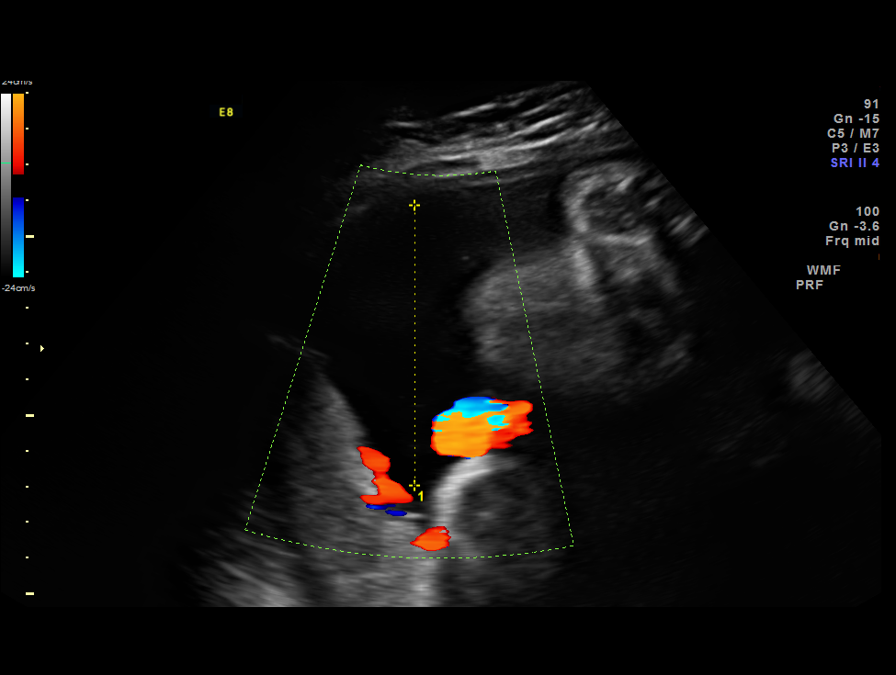
[im 3/9]
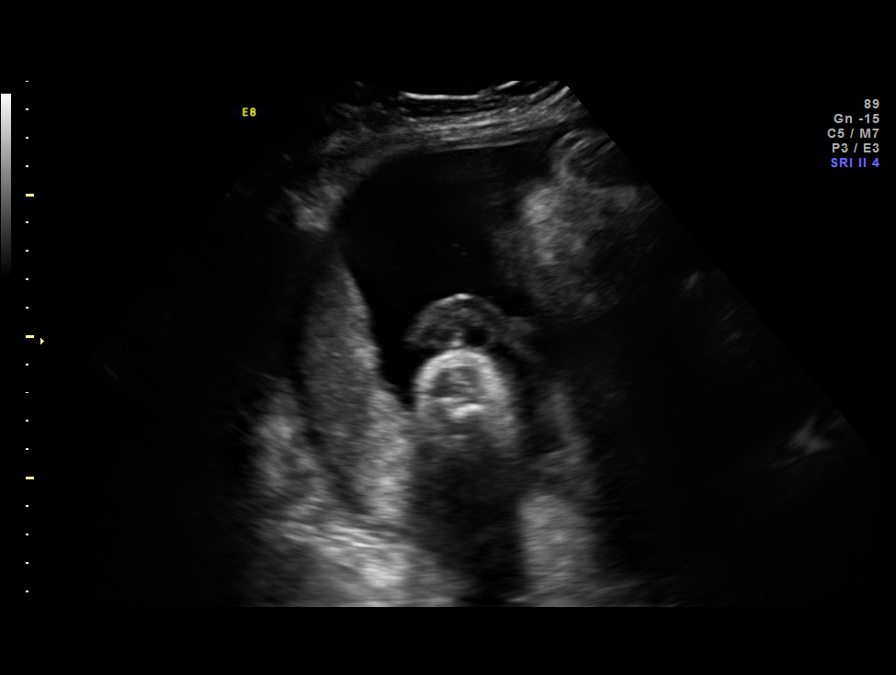
[im 4/9]
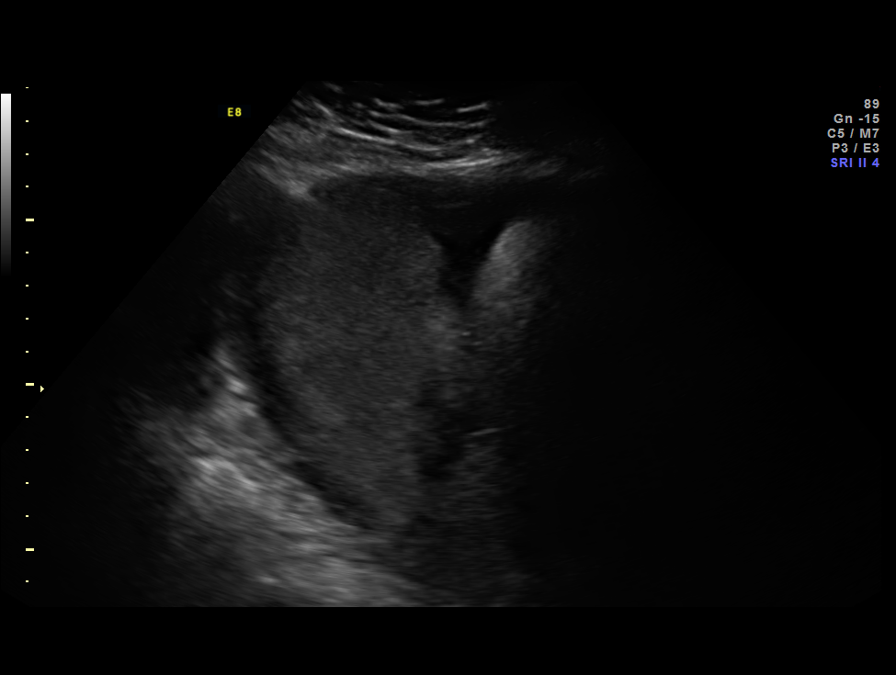
[im 5/9]
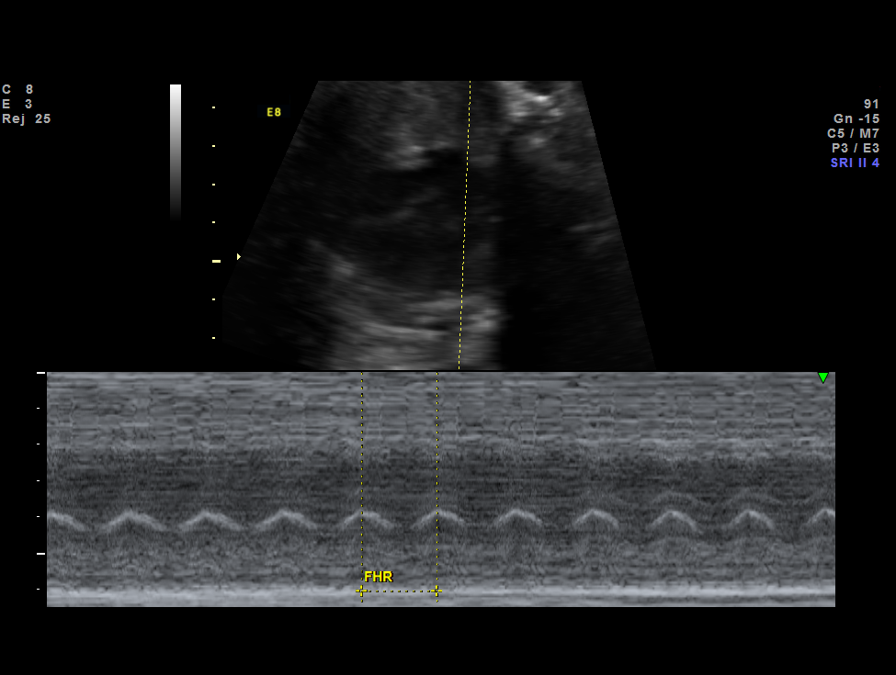
[im 6/9]
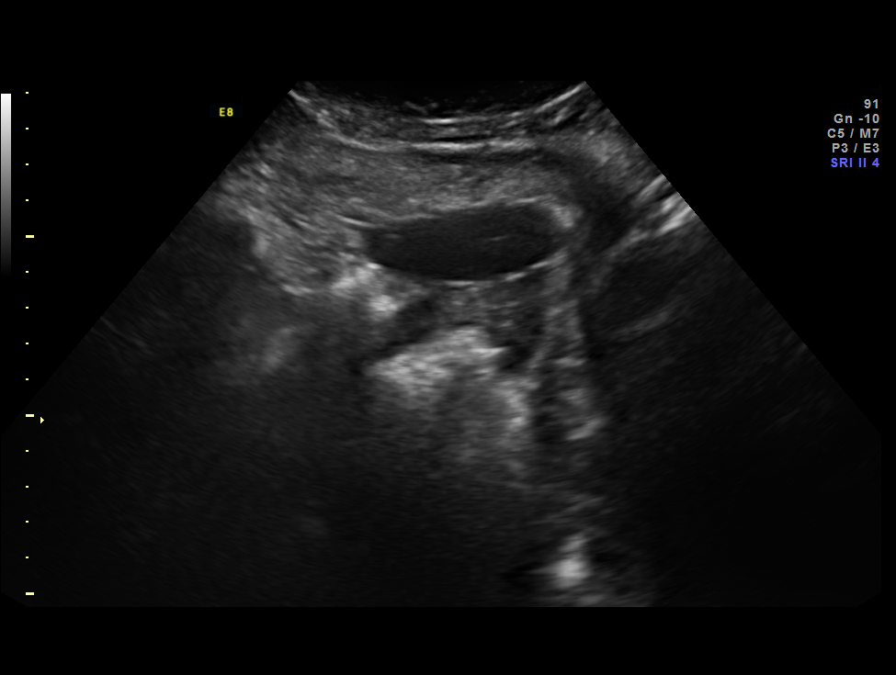
[im 7/9]
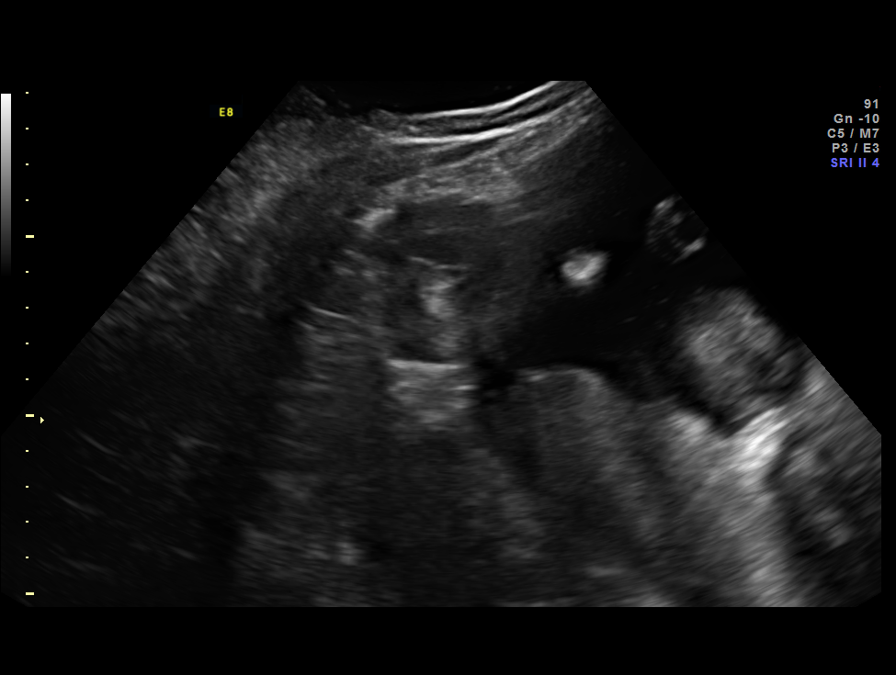
[im 8/9]
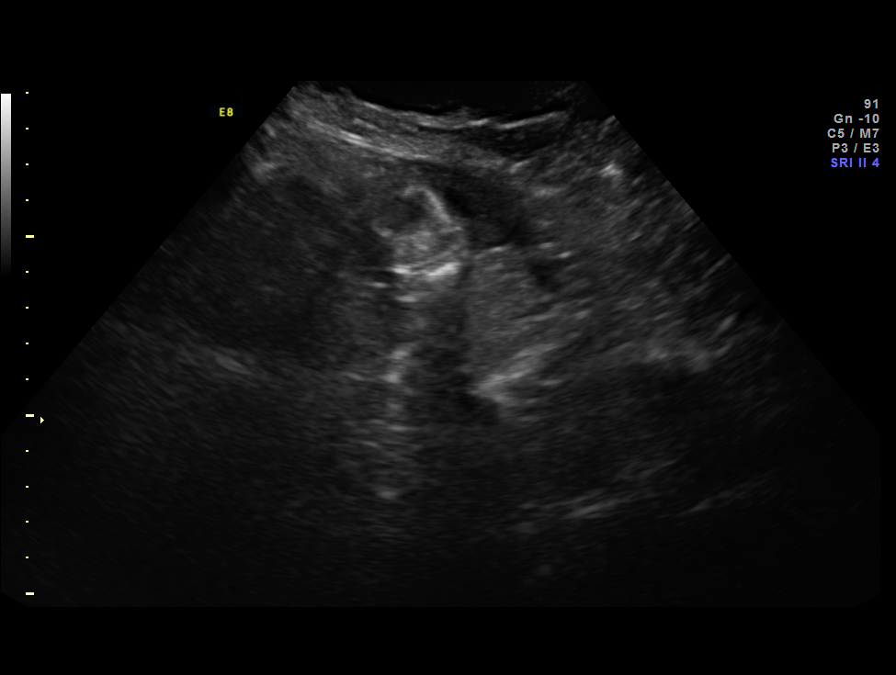
[im 9/9]
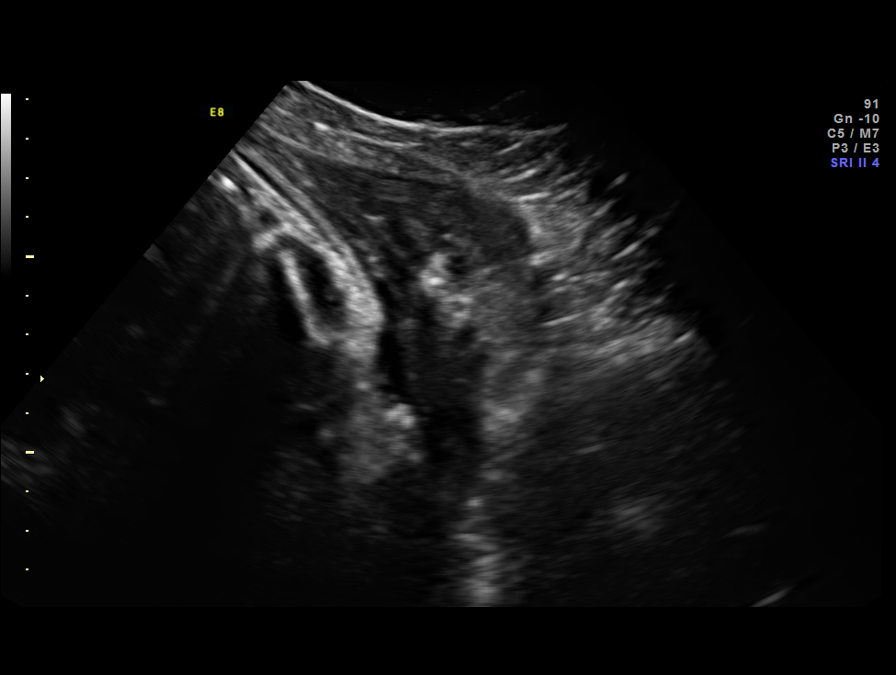

[9 of 9 positions shown; findings below may reference images not displayed]

OBSTETRICS REPORT
                      (Signed Final 07/30/2011 [DATE])

 Order#:         [id number]
Procedures

 [HOSPITAL]                                         76815.0
Indications

 Maternal intrahepatic cholestasis of pregnancy
 Assess fetal well being
Fetal Evaluation

 Fetal Heart Rate:  165                          bpm
 Cardiac Activity:  Observed
 Presentation:      Cephalic
 Placenta:          Posterior, above cervical
                    os
 P. Cord            Previously Visualized
 Insertion:

 Amniotic Fluid
 AFI FV:      Subjectively within normal limits
                                             Larg Pckt:     7.8  cm
Gestational Age

 LMP:           34w 4d        Date:  11/30/10                 EDD:   09/06/11
 Best:          34w 4d     Det. By:  LMP  (11/30/10)          EDD:   09/06/11
Cervix Uterus Adnexa

 Cervix:       Not visualized (advanced GA >29 wks)

 Left Ovary:    Within normal limits.
 Right Ovary:   Within normal limits.
 Adnexa:     No abnormality visualized.
Impression

 Single IUP at 34 [DATE] weeks
 Limited ultrasound performed - Max verticle pocket 7.8 cm
 Normal amniotic fluid volume
 Reactive NST
Recommendations

 Continue 2x weekly testing - plan delivery at 37 weeks due to
 intrahepatic cholestatsisof pregnancy.

 questions or concerns.

## 2013-10-18 ENCOUNTER — Emergency Department (HOSPITAL_COMMUNITY)
Admission: EM | Admit: 2013-10-18 | Discharge: 2013-10-18 | Disposition: A | Discharge status: Home / Self Care | Payer: 59 | Attending: Emergency Medicine | Admitting: Emergency Medicine

## 2013-10-18 ENCOUNTER — Encounter (HOSPITAL_COMMUNITY): Payer: Self-pay | Admitting: Emergency Medicine

## 2013-10-18 DIAGNOSIS — Z3202 Encounter for pregnancy test, result negative: Secondary | ICD-10-CM | POA: Insufficient documentation

## 2013-10-18 DIAGNOSIS — Z8719 Personal history of other diseases of the digestive system: Secondary | ICD-10-CM | POA: Insufficient documentation

## 2013-10-18 DIAGNOSIS — N72 Inflammatory disease of cervix uteri: Secondary | ICD-10-CM | POA: Insufficient documentation

## 2013-10-18 DIAGNOSIS — Z8619 Personal history of other infectious and parasitic diseases: Secondary | ICD-10-CM | POA: Insufficient documentation

## 2013-10-18 DIAGNOSIS — R51 Headache: Secondary | ICD-10-CM | POA: Diagnosis not present

## 2013-10-18 DIAGNOSIS — R109 Unspecified abdominal pain: Secondary | ICD-10-CM | POA: Diagnosis present

## 2013-10-18 DIAGNOSIS — R200 Anesthesia of skin: Secondary | ICD-10-CM | POA: Diagnosis not present

## 2013-10-18 LAB — CBC WITH DIFFERENTIAL/PLATELET
BASOS PCT: 0 % (ref 0–1)
Basophils Absolute: 0 10*3/uL (ref 0.0–0.1)
EOS ABS: 0.1 10*3/uL (ref 0.0–0.7)
Eosinophils Relative: 1 % (ref 0–5)
HCT: 34.1 % — ABNORMAL LOW (ref 36.0–46.0)
Hemoglobin: 10.6 g/dL — ABNORMAL LOW (ref 12.0–15.0)
LYMPHS ABS: 2.7 10*3/uL (ref 0.7–4.0)
Lymphocytes Relative: 38 % (ref 12–46)
MCH: 23.5 pg — AB (ref 26.0–34.0)
MCHC: 31.1 g/dL (ref 30.0–36.0)
MCV: 75.6 fL — ABNORMAL LOW (ref 78.0–100.0)
Monocytes Absolute: 0.4 10*3/uL (ref 0.1–1.0)
Monocytes Relative: 5 % (ref 3–12)
NEUTROS PCT: 56 % (ref 43–77)
Neutro Abs: 3.9 10*3/uL (ref 1.7–7.7)
Platelets: 382 10*3/uL (ref 150–400)
RBC: 4.51 MIL/uL (ref 3.87–5.11)
RDW: 16.1 % — ABNORMAL HIGH (ref 11.5–15.5)
WBC: 7.2 10*3/uL (ref 4.0–10.5)

## 2013-10-18 LAB — COMPREHENSIVE METABOLIC PANEL
ALBUMIN: 3.5 g/dL (ref 3.5–5.2)
ALK PHOS: 95 U/L (ref 39–117)
ALT: 13 U/L (ref 0–35)
AST: 13 U/L (ref 0–37)
Anion gap: 11 (ref 5–15)
BUN: 12 mg/dL (ref 6–23)
CO2: 25 mEq/L (ref 19–32)
Calcium: 9.4 mg/dL (ref 8.4–10.5)
Chloride: 102 mEq/L (ref 96–112)
Creatinine, Ser: 0.76 mg/dL (ref 0.50–1.10)
GFR calc Af Amer: >90 mL/min (ref >90)
GFR calc non Af Amer: >90 mL/min (ref >90)
GLUCOSE: 101 mg/dL — AB (ref 70–99)
Potassium: 4.1 mEq/L (ref 3.7–5.3)
SODIUM: 138 meq/L (ref 137–147)
TOTAL PROTEIN: 7.7 g/dL (ref 6.0–8.3)
Total Bilirubin: 0.2 mg/dL — ABNORMAL LOW (ref 0.3–1.2)

## 2013-10-18 LAB — POC URINE PREG, ED: PREG TEST UR: NEGATIVE

## 2013-10-18 LAB — WET PREP, GENITAL
Clue Cells Wet Prep HPF POC: NONE SEEN
Trich, Wet Prep: NONE SEEN
Yeast Wet Prep HPF POC: NONE SEEN

## 2013-10-18 LAB — HIV ANTIBODY (ROUTINE TESTING W REFLEX): HIV: NONREACTIVE

## 2013-10-18 LAB — URINALYSIS, ROUTINE W REFLEX MICROSCOPIC
Bilirubin Urine: NEGATIVE
GLUCOSE, UA: NEGATIVE mg/dL
HGB URINE DIPSTICK: NEGATIVE
Ketones, ur: NEGATIVE mg/dL
Nitrite: NEGATIVE
Protein, ur: NEGATIVE mg/dL
SPECIFIC GRAVITY, URINE: 1.022 (ref 1.005–1.030)
Urobilinogen, UA: 0.2 mg/dL (ref 0.0–1.0)
pH: 6 (ref 5.0–8.0)

## 2013-10-18 LAB — LIPASE, BLOOD: Lipase: 13 U/L (ref 11–59)

## 2013-10-18 LAB — URINE MICROSCOPIC-ADD ON

## 2013-10-18 LAB — RPR

## 2013-10-18 MED ORDER — CEFTRIAXONE SODIUM 250 MG IJ SOLR
250.0000 mg | Freq: Once | INTRAMUSCULAR | Status: AC
  Administered 2013-10-18: 250 mg via INTRAMUSCULAR
  Filled 2013-10-18: qty 250

## 2013-10-18 MED ORDER — LIDOCAINE HCL 1 % IJ SOLN
INTRAMUSCULAR | Status: AC
  Administered 2013-10-18: 20 mL
  Filled 2013-10-18: qty 20

## 2013-10-18 MED ORDER — AZITHROMYCIN 1 G PO PACK
1.0000 g | PACK | Freq: Once | ORAL | Status: AC
  Administered 2013-10-18: 1 g via ORAL
  Filled 2013-10-18: qty 1

## 2013-10-18 NOTE — ED Notes (Signed)
Pt reports bilateral lower abd pain. No n/v/d or dysuria. Pt reports her urine is cloudy, vaginal discharge normal for her, no odor or color. Pt reports headache on R forehead, no sensitivity to light or sound, pt states she normally does not get headaches. Also reports bilateral hand numbness when she sleeps,

## 2013-10-18 NOTE — Discharge Instructions (Signed)
Cervicitis Call your gynecologist today to schedule the next available appointment. You have tested you for 6 or transmitted diseases and HIV (AIDS). You will be contacted if the HIV test is abnormal Cervicitis is a soreness and puffiness (inflammation) of the cervix.  HOME CARE  Do not have sex (intercourse) until your doctor says it is okay.  Do not have sex until your partner is treated or as told by your doctor.  Take your antibiotic medicine as told. Finish it even if you start to feel better. GET HELP IF:   Your symptoms that brought you to the doctor come back.  You have a fever. MAKE SURE YOU:   Understand these instructions.  Will watch your condition.  Will get help right away if you are not doing well or get worse. Document Released: 10/14/2007 Document Revised: 01/09/2013 Document Reviewed: 06/28/2012 [facility] Patient Information 2015 [place], [place]. This information is not intended to replace advice given to you by your health care provider. Make sure you discuss any questions you have with your health care provider.

## 2013-10-18 NOTE — ED Notes (Signed)
Pt alert and oriented x4. Respirations even and unlabored, bilateral symmetrical rise and fall of chest. Skin warm and dry. In no acute distress. Denies needs.   

## 2013-10-18 NOTE — ED Provider Notes (Signed)
CSN: [id number]     Arrival date & time 10/18/13  1124 History   First MD Initiated Contact with Patient 10/18/13 1220     Chief Complaint  Patient presents with  . Abdominal Pain  . Headache     (Consider location/radiation/quality/duration/timing/severity/associated sxs/prior Treatment) HPI Complains of low abdominal pain, intermittent, mild with each episode lasting approximately 20 minutes onset 2 weeks ago. She denies vaginal discharge denies urinary symptoms no appetite change last bowel movement yesterday normal. Last normal menstrual period first week of August 2015. No fever no treatment prior to coming here. Patient also complains of intermittent left-sided temporal headache for one week. No headache now. She is presently asymptomatic she denies visual changes, photophobia or nausea. Other symptoms include numbness in both hands when she sleeps for the past 2 weeks. Nothing makes symptoms better or worse. No other associated symptoms. No treatment prior to coming here. Patient presently symptom  Past Medical History  Diagnosis Date  . Chlamydia   . Cholestasis of pregnancy    Past Surgical History  Procedure Laterality Date  . No past surgeries     Family History  Problem Relation Age of Onset  . Hypertension Father   . Cancer Maternal Grandmother     Breast/tn  72 when she passed  . Other Neg Hx    History  Substance Use Topics  . Smoking status: Never Smoker   . Smokeless tobacco: Never Used  . Alcohol Use: No   OB History   Grav Para Term Preterm Abortions TAB SAB Ect Mult Living   2 1  1 1 1   0  1     Review of Systems  Constitutional: Negative.   Respiratory: Negative.   Cardiovascular: Negative.   Gastrointestinal: Positive for abdominal pain.  Genitourinary:       Irregular menses  Musculoskeletal: Negative.   Skin: Negative.   Neurological: Positive for numbness and headaches.  Psychiatric/Behavioral: Negative.   All other systems reviewed and are  negative.     Allergies  Review of patient's allergies indicates no known allergies.  Home Medications   Prior to Admission medications   Medication Sig Start Date End Date Taking? Authorizing Provider  butalbital-acetaminophen-caffeine (FIORICET, ESGIC) 50-325-40 MG per tablet Take 2 tablets by mouth once.   Yes Historical Provider, MD   BP 125/75  Pulse 100  Temp(Src) 98.2 F (36.8 C) (Oral)  Resp 16  SpO2 100% Physical Exam  Nursing note and vitals reviewed. Constitutional: She is oriented to person, place, and time. She appears well-developed and well-nourished.  HENT:  Head: Normocephalic and atraumatic.  Eyes: Conjunctivae are normal. Pupils are equal, round, and reactive to light.  Neck: Neck supple. No tracheal deviation present. No thyromegaly present.  Cardiovascular: Normal rate and regular rhythm.   No murmur heard. Pulmonary/Chest: Effort normal and breath sounds normal.  Abdominal: Soft. Bowel sounds are normal. She exhibits no distension. There is tenderness.  Obese. Mild suprapubic tenderness  Genitourinary:  No external lesion. Yellowish vaginal discharge. No blood in vault. Cervical os closed. Cervix appears reddened, and inflamed. No cervical motion tenderness no adnexal masses or tenderness  Musculoskeletal: Normal range of motion. She exhibits no edema and no tenderness.  Neurological: She is alert and oriented to person, place, and time. No cranial nerve deficit. Coordination normal.  Cranial nerves II through XII grossly intact, gait normal Romberg normal pronator drift normal DTRs symmetric bilaterally at knee jerk ankle jerk and biceps toes downward going bilaterally  Skin: Skin is warm and dry. No rash noted.  Psychiatric: She has a normal mood and affect.    ED Course  Procedures (including critical care time) Labs Review Labs Reviewed  CBC WITH DIFFERENTIAL  COMPREHENSIVE METABOLIC PANEL  LIPASE, BLOOD  URINALYSIS, ROUTINE W REFLEX  MICROSCOPIC  POC URINE PREG, ED    Imaging Review No results found.   EKG Interpretation None     Results for orders placed during the hospital encounter of 10/18/13  WET PREP, GENITAL      Result Value Ref Range   Yeast Wet Prep HPF POC NONE SEEN  NONE SEEN   Trich, Wet Prep NONE SEEN  NONE SEEN   Clue Cells Wet Prep HPF POC NONE SEEN  NONE SEEN   WBC, Wet Prep HPF POC FEW (*) NONE SEEN  CBC WITH DIFFERENTIAL      Result Value Ref Range   WBC 7.2  4.0 - 10.5 K/uL   RBC 4.51  3.87 - 5.11 MIL/uL   Hemoglobin 10.6 (*) 12.0 - 15.0 g/dL   HCT 96.0 (*) 45.4 - 09.8 %   MCV 75.6 (*) 78.0 - 100.0 fL   MCH 23.5 (*) 26.0 - 34.0 pg   MCHC 31.1  30.0 - 36.0 g/dL   RDW 11.9 (*) 14.7 - 82.9 %   Platelets 382  150 - 400 K/uL   Neutrophils Relative % 56  43 - 77 %   Neutro Abs 3.9  1.7 - 7.7 K/uL   Lymphocytes Relative 38  12 - 46 %   Lymphs Abs 2.7  0.7 - 4.0 K/uL   Monocytes Relative 5  3 - 12 %   Monocytes Absolute 0.4  0.1 - 1.0 K/uL   Eosinophils Relative 1  0 - 5 %   Eosinophils Absolute 0.1  0.0 - 0.7 K/uL   Basophils Relative 0  0 - 1 %   Basophils Absolute 0.0  0.0 - 0.1 K/uL  COMPREHENSIVE METABOLIC PANEL      Result Value Ref Range   Sodium 138  137 - 147 mEq/L   Potassium 4.1  3.7 - 5.3 mEq/L   Chloride 102  96 - 112 mEq/L   CO2 25  19 - 32 mEq/L   Glucose, Bld 101 (*) 70 - 99 mg/dL   BUN 12  6 - 23 mg/dL   Creatinine, Ser 5.62  0.50 - 1.10 mg/dL   Calcium 9.4  8.4 - 13.0 mg/dL   Total Protein 7.7  6.0 - 8.3 g/dL   Albumin 3.5  3.5 - 5.2 g/dL   AST 13  0 - 37 U/L   ALT 13  0 - 35 U/L   Alkaline Phosphatase 95  39 - 117 U/L   Total Bilirubin 0.2 (*) 0.3 - 1.2 mg/dL   GFR calc non Af Amer >90  >90 mL/min   GFR calc Af Amer >90  >90 mL/min   Anion gap 11  5 - 15  LIPASE, BLOOD      Result Value Ref Range   Lipase 13  11 - 59 U/L  POC URINE PREG, ED      Result Value Ref Range   Preg Test, Ur NEGATIVE  NEGATIVE   No results found.  MDM   Final diagnoses:   None   patient's exam consistent with cervicitis cultures pending. Rocephin, Zithromax, follow up with her gynecologist. She has not seen her gynecologist since the birth of her son 2 years ago.anemia is  chronic Diagnoses #1 cervicitis #2 nonspecific headache #3 microcytic anemia     Doug SouSam Eleny Cortez, MD 10/18/13 1338

## 2013-10-19 LAB — GC/CHLAMYDIA PROBE AMP
CT Probe RNA: NEGATIVE
GC PROBE AMP APTIMA: NEGATIVE

## 2013-11-07 ENCOUNTER — Encounter: Payer: Self-pay | Admitting: Advanced Practice Midwife

## 2013-11-07 ENCOUNTER — Ambulatory Visit (INDEPENDENT_AMBULATORY_CARE_PROVIDER_SITE_OTHER): Payer: 59 | Admitting: Advanced Practice Midwife

## 2013-11-07 VITALS — BP 124/81 | HR 109 | Ht 62.0 in | Wt 215.0 lb

## 2013-11-07 DIAGNOSIS — N912 Amenorrhea, unspecified: Secondary | ICD-10-CM

## 2013-11-07 LAB — POCT URINE PREGNANCY: PREG TEST UR: NEGATIVE

## 2013-11-07 MED ORDER — MEDROXYPROGESTERONE ACETATE 150 MG/ML IM SUSP
150.0000 mg | INTRAMUSCULAR | Status: DC
  Administered 2014-02-01 – 2016-03-02 (×6): 150 mg via INTRAMUSCULAR

## 2013-11-07 NOTE — Patient Instructions (Signed)
Miscarriage A miscarriage is the sudden loss of an unborn baby (fetus) before the 20th week of pregnancy. Most miscarriages happen in the first 3 months of pregnancy. Sometimes, it happens before a woman even knows she is pregnant. A miscarriage is also called a "spontaneous miscarriage" or "early pregnancy loss." Having a miscarriage can be an emotional experience. Talk with your caregiver about any questions you may have about miscarrying, the grieving process, and your future pregnancy plans. CAUSES   Problems with the fetal chromosomes that make it impossible for the baby to develop normally. Problems with the baby's genes or chromosomes are most often the result of errors that occur, by chance, as the embryo divides and grows. The problems are not inherited from the parents.  Infection of the cervix or uterus.   Hormone problems.   Problems with the cervix, such as having an incompetent cervix. This is when the tissue in the cervix is not strong enough to hold the pregnancy.   Problems with the uterus, such as an abnormally shaped uterus, uterine fibroids, or congenital abnormalities.   Certain medical conditions.   Smoking, drinking alcohol, or taking illegal drugs.   Trauma.  Often, the cause of a miscarriage is unknown.  SYMPTOMS   Vaginal bleeding or spotting, with or without cramps or pain.  Pain or cramping in the abdomen or lower back.  Passing fluid, tissue, or blood clots from the vagina. DIAGNOSIS  Your caregiver will perform a physical exam. You may also have an ultrasound to confirm the miscarriage. Blood or urine tests may also be ordered. TREATMENT   Sometimes, treatment is not necessary if you naturally pass all the fetal tissue that was in the uterus. If some of the fetus or placenta remains in the body (incomplete miscarriage), tissue left behind may become infected and must be removed. Usually, a dilation and curettage (D and C) procedure is performed.  During a D and C procedure, the cervix is widened (dilated) and any remaining fetal or placental tissue is gently removed from the uterus.  Antibiotic medicines are prescribed if there is an infection. Other medicines may be given to reduce the size of the uterus (contract) if there is a lot of bleeding.  If you have Rh negative blood and your baby was Rh positive, you will need a Rh immunoglobulin shot. This shot will protect any future baby from having Rh blood problems in future pregnancies. HOME CARE INSTRUCTIONS   Your caregiver may order bed rest or may allow you to continue light activity. Resume activity as directed by your caregiver.  Have someone help with home and family responsibilities during this time.   Keep track of the number of sanitary pads you use each day and how soaked (saturated) they are. Write down this information.   Do not use tampons. Do not douche or have sexual intercourse until approved by your caregiver.   Only take over-the-counter or prescription medicines for pain or discomfort as directed by your caregiver.   Do not take aspirin. Aspirin can cause bleeding.   Keep all follow-up appointments with your caregiver.   If you or your partner have problems with grieving, talk to your caregiver or seek counseling to help cope with the pregnancy loss. Allow enough time to grieve before trying to get pregnant again.  SEEK IMMEDIATE MEDICAL CARE IF:   You have severe cramps or pain in your back or abdomen.  You have a fever.  You pass large blood clots (walnut-sized   or larger) ortissue from your vagina. Save any tissue for your caregiver to inspect.   Your bleeding increases.   You have a thick, bad-smelling vaginal discharge.  You become lightheaded, weak, or you faint.   You have chills.  MAKE SURE YOU:  Understand these instructions.  Will watch your condition.  Will get help right away if you are not doing well or get  worse. Document Released: 06/30/2000 Document Revised: 05/01/2012 Document Reviewed: 02/23/2011 [employer] Patient Information 2015 [employer]. This information is not intended to replace advice given to you by your health care provider. Make sure you discuss any questions you have with your health care provider.  

## 2013-11-07 NOTE — Progress Notes (Signed)
   Subjective:    Patient ID: Renee Skinner, female    DOB: [birth year], 28 y.o.   MRN: [id number]  HPI This is a 28 y.o. female who presents with c/o secondary amenorrhea. States had normal periods in July and August but only spotting in September and October. Had a baby at 36 weeks in 2013 (cholestasis) and then a possible pregnancy in 2014.  Does not want to be pregnant right now. Had 2 + pregnancy tests within 3 minutes at home, then negative this week. Had a normal pelvic exam by Dr Rennis ChrisJacobowitz a week or two ago.  All cultures neg. If not pregnant, wants DepoProvera.   Review of Systems  Constitutional: Positive for fatigue. Negative for activity change and appetite change.  Gastrointestinal: Negative for abdominal pain and abdominal distention.  Genitourinary: Positive for menstrual problem. Negative for vaginal discharge and vaginal pain.       Objective:   Physical Exam  Constitutional: She is oriented to person, place, and time. She appears well-developed and well-nourished. No distress.  Cardiovascular: Normal rate.   Pulmonary/Chest: Effort normal.  Genitourinary:  Exam deferred due to recent full pelvic exam  Musculoskeletal: Normal range of motion.  Neurological: She is alert and oriented to person, place, and time.  Skin: Skin is warm and dry.  Psychiatric: She has a normal mood and affect.          Assessment & Plan:  A:  Positive pregnancy test at home      Followed by negative pregnancy test      Most likely represents SAB, early   P:  Will check Quant HCG level today       If negative, start depo Provera       If positive, repeat in 48 hours

## 2013-11-08 LAB — HCG, QUANTITATIVE, PREGNANCY: hCG, Beta Chain, Quant, S: <2 m[IU]/mL

## 2013-11-09 ENCOUNTER — Encounter (HOSPITAL_COMMUNITY): Payer: Self-pay | Admitting: Advanced Practice Midwife

## 2013-11-09 ENCOUNTER — Ambulatory Visit (INDEPENDENT_AMBULATORY_CARE_PROVIDER_SITE_OTHER): Payer: 59 | Admitting: *Deleted

## 2013-11-09 DIAGNOSIS — Z3042 Encounter for surveillance of injectable contraceptive: Secondary | ICD-10-CM

## 2013-11-09 MED ORDER — MEDROXYPROGESTERONE ACETATE 150 MG/ML IM SUSP
150.0000 mg | Freq: Once | INTRAMUSCULAR | Status: AC
  Administered 2013-11-09: 150 mg via INTRAMUSCULAR

## 2013-11-09 NOTE — Progress Notes (Signed)
Pt came in today for her Depo Provera.

## 2013-11-19 ENCOUNTER — Encounter: Payer: Self-pay | Admitting: Advanced Practice Midwife

## 2014-02-01 ENCOUNTER — Ambulatory Visit (INDEPENDENT_AMBULATORY_CARE_PROVIDER_SITE_OTHER): Payer: 59 | Admitting: *Deleted

## 2014-02-01 DIAGNOSIS — Z3042 Encounter for surveillance of injectable contraceptive: Secondary | ICD-10-CM

## 2014-02-01 NOTE — Progress Notes (Signed)
Patient is doing well with Depo, just some spotting but she was reassured it can take a couple of doses to resolve this.

## 2014-02-04 ENCOUNTER — Ambulatory Visit: Payer: 59

## 2014-04-29 ENCOUNTER — Ambulatory Visit: Payer: 59

## 2014-11-25 ENCOUNTER — Encounter: Payer: 59 | Admitting: Obstetrics & Gynecology

## 2014-11-28 ENCOUNTER — Encounter: Payer: Self-pay | Admitting: Gastroenterology

## 2014-12-03 ENCOUNTER — Ambulatory Visit: Payer: 59 | Admitting: Gastroenterology

## 2014-12-31 ENCOUNTER — Ambulatory Visit: Payer: Self-pay | Admitting: General Surgery

## 2015-02-12 ENCOUNTER — Ambulatory Visit (INDEPENDENT_AMBULATORY_CARE_PROVIDER_SITE_OTHER): Payer: Commercial Managed Care - HMO | Admitting: Family Medicine

## 2015-02-12 ENCOUNTER — Other Ambulatory Visit: Payer: Self-pay | Admitting: Family Medicine

## 2015-02-12 ENCOUNTER — Encounter: Payer: Self-pay | Admitting: Family Medicine

## 2015-02-12 VITALS — BP 117/80 | HR 83 | Ht 62.0 in | Wt 223.0 lb

## 2015-02-12 DIAGNOSIS — Z3042 Encounter for surveillance of injectable contraceptive: Secondary | ICD-10-CM

## 2015-02-12 DIAGNOSIS — Z01419 Encounter for gynecological examination (general) (routine) without abnormal findings: Secondary | ICD-10-CM | POA: Diagnosis not present

## 2015-02-12 DIAGNOSIS — Z01812 Encounter for preprocedural laboratory examination: Secondary | ICD-10-CM

## 2015-02-12 DIAGNOSIS — Z124 Encounter for screening for malignant neoplasm of cervix: Secondary | ICD-10-CM

## 2015-02-12 LAB — COMPREHENSIVE METABOLIC PANEL
ALBUMIN: 3.8 g/dL (ref 3.6–5.1)
ALK PHOS: 90 U/L (ref 33–115)
ALT: 12 U/L (ref 6–29)
AST: 13 U/L (ref 10–30)
BILIRUBIN TOTAL: 0.4 mg/dL (ref 0.2–1.2)
BUN: 13 mg/dL (ref 7–25)
CALCIUM: 9.5 mg/dL (ref 8.6–10.2)
CO2: 27 mmol/L (ref 20–31)
Chloride: 101 mmol/L (ref 98–110)
Creat: 0.68 mg/dL (ref 0.50–1.10)
Glucose, Bld: 93 mg/dL (ref 65–99)
POTASSIUM: 4.1 mmol/L (ref 3.5–5.3)
Sodium: 137 mmol/L (ref 135–146)
Total Protein: 7.8 g/dL (ref 6.1–8.1)

## 2015-02-12 LAB — LIPID PANEL
CHOL/HDL RATIO: 2.1 ratio (ref <=5.0)
Cholesterol: 106 mg/dL — ABNORMAL LOW (ref 125–200)
HDL: 51 mg/dL (ref >=46)
LDL Cholesterol: 35 mg/dL (ref <130)
TRIGLYCERIDES: 100 mg/dL (ref <150)
VLDL: 20 mg/dL (ref <30)

## 2015-02-12 LAB — CBC
HEMATOCRIT: 37.7 % (ref 36.0–46.0)
HEMOGLOBIN: 12 g/dL (ref 12.0–15.0)
MCH: 26.1 pg (ref 26.0–34.0)
MCHC: 31.8 g/dL (ref 30.0–36.0)
MCV: 82 fL (ref 78.0–100.0)
MPV: 10.6 fL (ref 8.6–12.4)
Platelets: 372 10*3/uL (ref 150–400)
RBC: 4.6 MIL/uL (ref 3.87–5.11)
RDW: 15.4 % (ref 11.5–15.5)
WBC: 8.5 10*3/uL (ref 4.0–10.5)

## 2015-02-12 LAB — POCT URINE PREGNANCY: PREG TEST UR: NEGATIVE

## 2015-02-12 MED ORDER — MEDROXYPROGESTERONE ACETATE 150 MG/ML IM SUSP
150.0000 mg | Freq: Once | INTRAMUSCULAR | Status: AC
  Administered 2015-02-12: 150 mg via INTRAMUSCULAR

## 2015-02-12 NOTE — Patient Instructions (Signed)
Preventive Care for Adults, Female A healthy lifestyle and preventive care can promote health and wellness. Preventive health guidelines for women include the following key practices.  A routine yearly physical is a good way to check with your health care provider about your health and preventive screening. It is a chance to share any concerns and updates on your health and to receive a thorough exam.  Visit your dentist for a routine exam and preventive care every 6 months. Brush your teeth twice a day and floss once a day. Good oral hygiene prevents tooth decay and gum disease.  The frequency of eye exams is based on your age, health, family medical history, use of contact lenses, and other factors. Follow your health care provider's recommendations for frequency of eye exams.  Eat a healthy diet. Foods like vegetables, fruits, whole grains, low-fat dairy products, and lean protein foods contain the nutrients you need without too many calories. Decrease your intake of foods high in solid fats, added sugars, and salt. Eat the right amount of calories for you.Get information about a proper diet from your health care provider, if necessary.  Regular physical exercise is one of the most important things you can do for your health. Most adults should get at least 150 minutes of moderate-intensity exercise (any activity that increases your heart rate and causes you to sweat) each week. In addition, most adults need muscle-strengthening exercises on 2 or more days a week.  Maintain a healthy weight. The body mass index (BMI) is a screening tool to identify possible weight problems. It provides an estimate of body fat based on height and weight. Your health care provider can find your BMI and can help you achieve or maintain a healthy weight.For adults 20 years and older:  A BMI below 18.5 is considered underweight.  A BMI of 18.5 to 24.9 is normal.  A BMI of 25 to 29.9 is considered overweight.  A  BMI of 30 and above is considered obese.  Maintain normal blood lipids and cholesterol levels by exercising and minimizing your intake of saturated fat. Eat a balanced diet with plenty of fruit and vegetables. Blood tests for lipids and cholesterol should begin at age 45 and be repeated every 5 years. If your lipid or cholesterol levels are high, you are over 50, or you are at high risk for heart disease, you may need your cholesterol levels checked more frequently.Ongoing high lipid and cholesterol levels should be treated with medicines if diet and exercise are not working.  If you smoke, find out from your health care provider how to quit. If you do not use tobacco, do not start.  Lung cancer screening is recommended for adults aged 45-80 years who are at high risk for developing lung cancer because of a history of smoking. A yearly low-dose CT scan of the lungs is recommended for people who have at least a 30-pack-year history of smoking and are a current smoker or have quit within the past 15 years. A pack year of smoking is smoking an average of 1 pack of cigarettes a day for 1 year (for example: 1 pack a day for 30 years or 2 packs a day for 15 years). Yearly screening should continue until the smoker has stopped smoking for at least 15 years. Yearly screening should be stopped for people who develop a health problem that would prevent them from having lung cancer treatment.  If you are pregnant, do not drink alcohol. If you are  breastfeeding, be very cautious about drinking alcohol. If you are not pregnant and choose to drink alcohol, do not have more than 1 drink per day. One drink is considered to be 12 ounces (355 mL) of beer, 5 ounces (148 mL) of wine, or 1.5 ounces (44 mL) of liquor.  Avoid use of street drugs. Do not share needles with anyone. Ask for help if you need support or instructions about stopping the use of drugs.  High blood pressure causes heart disease and increases the risk  of stroke. Your blood pressure should be checked at least every 1 to 2 years. Ongoing high blood pressure should be treated with medicines if weight loss and exercise do not work.  If you are 55-79 years old, ask your health care provider if you should take aspirin to prevent strokes.  Diabetes screening is done by taking a blood sample to check your blood glucose level after you have not eaten for a certain period of time (fasting). If you are not overweight and you do not have risk factors for diabetes, you should be screened once every 3 years starting at age 45. If you are overweight or obese and you are 40-70 years of age, you should be screened for diabetes every year as part of your cardiovascular risk assessment.  Breast cancer screening is essential preventive care for women. You should practice "breast self-awareness." This means understanding the normal appearance and feel of your breasts and may include breast self-examination. Any changes detected, no matter how small, should be reported to a health care provider. Women in their 20s and 30s should have a clinical breast exam (CBE) by a health care provider as part of a regular health exam every 1 to 3 years. After age 40, women should have a CBE every year. Starting at age 40, women should consider having a mammogram (breast X-ray test) every year. Women who have a family history of breast cancer should talk to their health care provider about genetic screening. Women at a high risk of breast cancer should talk to their health care providers about having an MRI and a mammogram every year.  Breast cancer gene (BRCA)-related cancer risk assessment is recommended for women who have family members with BRCA-related cancers. BRCA-related cancers include breast, ovarian, tubal, and peritoneal cancers. Having family members with these cancers may be associated with an increased risk for harmful changes (mutations) in the breast cancer genes BRCA1 and  BRCA2. Results of the assessment will determine the need for genetic counseling and BRCA1 and BRCA2 testing.  Your health care provider may recommend that you be screened regularly for cancer of the pelvic organs (ovaries, uterus, and vagina). This screening involves a pelvic examination, including checking for microscopic changes to the surface of your cervix (Pap test). You may be encouraged to have this screening done every 3 years, beginning at age 21.  For women ages 30-65, health care providers may recommend pelvic exams and Pap testing every 3 years, or they may recommend the Pap and pelvic exam, combined with testing for human papilloma virus (HPV), every 5 years. Some types of HPV increase your risk of cervical cancer. Testing for HPV may also be done on women of any age with unclear Pap test results.  Other health care providers may not recommend any screening for nonpregnant women who are considered low risk for pelvic cancer and who do not have symptoms. Ask your health care provider if a screening pelvic exam is right for   you.  If you have had past treatment for cervical cancer or a condition that could lead to cancer, you need Pap tests and screening for cancer for at least 20 years after your treatment. If Pap tests have been discontinued, your risk factors (such as having a new sexual partner) need to be reassessed to determine if screening should resume. Some women have medical problems that increase the chance of getting cervical cancer. In these cases, your health care provider may recommend more frequent screening and Pap tests.  Colorectal cancer can be detected and often prevented. Most routine colorectal cancer screening begins at the age of 75 years and continues through age 71 years. However, your health care provider may recommend screening at an earlier age if you have risk factors for colon cancer. On a yearly basis, your health care provider may provide home test kits to check  for hidden blood in the stool. Use of a small camera at the end of a tube, to directly examine the colon (sigmoidoscopy or colonoscopy), can detect the earliest forms of colorectal cancer. Talk to your health care provider about this at age 36, when routine screening begins. Direct exam of the colon should be repeated every 5-10 years through age 29 years, unless early forms of precancerous polyps or small growths are found.  People who are at an increased risk for hepatitis B should be screened for this virus. You are considered at high risk for hepatitis B if:  You were born in a country where hepatitis B occurs often. Talk with your health care provider about which countries are considered high risk.  Your parents were born in a high-risk country and you have not received a shot to protect against hepatitis B (hepatitis B vaccine).  You have HIV or AIDS.  You use needles to inject street drugs.  You live with, or have sex with, someone who has hepatitis B.  You get hemodialysis treatment.  You take certain medicines for conditions like cancer, organ transplantation, and autoimmune conditions.  Hepatitis C blood testing is recommended for all people born from [birth year] through 1965 and any individual with known risks for hepatitis C.  Practice safe sex. Use condoms and avoid high-risk sexual practices to reduce the spread of sexually transmitted infections (STIs). STIs include gonorrhea, chlamydia, syphilis, trichomonas, herpes, HPV, and human immunodeficiency virus (HIV). Herpes, HIV, and HPV are viral illnesses that have no cure. They can result in disability, cancer, and death.  You should be screened for sexually transmitted illnesses (STIs) including gonorrhea and chlamydia if:  You are sexually active and are younger than 24 years.  You are older than 24 years and your health care provider tells you that you are at risk for this type of infection.  Your sexual activity has changed  since you were last screened and you are at an increased risk for chlamydia or gonorrhea. Ask your health care provider if you are at risk.  If you are at risk of being infected with HIV, it is recommended that you take a prescription medicine daily to prevent HIV infection. This is called preexposure prophylaxis (PrEP). You are considered at risk if:  You are sexually active and do not regularly use condoms or know the HIV status of your partner(s).  You take drugs by injection.  You are sexually active with a partner who has HIV.  Talk with your health care provider about whether you are at high risk of being infected with HIV. If  you choose to begin PrEP, you should first be tested for HIV. You should then be tested every 3 months for as long as you are taking PrEP.  Osteoporosis is a disease in which the bones lose minerals and strength with aging. This can result in serious bone fractures or breaks. The risk of osteoporosis can be identified using a bone density scan. Women ages 67 years and over and women at risk for fractures or osteoporosis should discuss screening with their health care providers. Ask your health care provider whether you should take a calcium supplement or vitamin D to reduce the rate of osteoporosis.  Menopause can be associated with physical symptoms and risks. Hormone replacement therapy is available to decrease symptoms and risks. You should talk to your health care provider about whether hormone replacement therapy is right for you.  Use sunscreen. Apply sunscreen liberally and repeatedly throughout the day. You should seek shade when your shadow is shorter than you. Protect yourself by wearing long sleeves, pants, a wide-brimmed hat, and sunglasses year round, whenever you are outdoors.  Once a month, do a whole body skin exam, using a mirror to look at the skin on your back. Tell your health care provider of new moles, moles that have irregular borders, moles that  are larger than a pencil eraser, or moles that have changed in shape or color.  Stay current with required vaccines (immunizations).  Influenza vaccine. All adults should be immunized every year.  Tetanus, diphtheria, and acellular pertussis (Td, Tdap) vaccine. Pregnant women should receive 1 dose of Tdap vaccine during each pregnancy. The dose should be obtained regardless of the length of time since the last dose. Immunization is preferred during the 27th-36th week of gestation. An adult who has not previously received Tdap or who does not know her vaccine status should receive 1 dose of Tdap. This initial dose should be followed by tetanus and diphtheria toxoids (Td) booster doses every 10 years. Adults with an unknown or incomplete history of completing a 3-dose immunization series with Td-containing vaccines should begin or complete a primary immunization series including a Tdap dose. Adults should receive a Td booster every 10 years.  Varicella vaccine. An adult without evidence of immunity to varicella should receive 2 doses or a second dose if she has previously received 1 dose. Pregnant females who do not have evidence of immunity should receive the first dose after pregnancy. This first dose should be obtained before leaving the health care facility. The second dose should be obtained 4-8 weeks after the first dose.  Human papillomavirus (HPV) vaccine. Females aged 13-26 years who have not received the vaccine previously should obtain the 3-dose series. The vaccine is not recommended for use in pregnant females. However, pregnancy testing is not needed before receiving a dose. If a female is found to be pregnant after receiving a dose, no treatment is needed. In that case, the remaining doses should be delayed until after the pregnancy. Immunization is recommended for any person with an immunocompromised condition through the age of 61 years if she did not get any or all doses earlier. During the  3-dose series, the second dose should be obtained 4-8 weeks after the first dose. The third dose should be obtained 24 weeks after the first dose and 16 weeks after the second dose.  Zoster vaccine. One dose is recommended for adults aged 30 years or older unless certain conditions are present.  Measles, mumps, and rubella (MMR) vaccine. Adults born  before 1957 generally are considered immune to measles and mumps. Adults born in 1957 or later should have 1 or more doses of MMR vaccine unless there is a contraindication to the vaccine or there is laboratory evidence of immunity to each of the three diseases. A routine second dose of MMR vaccine should be obtained at least 28 days after the first dose for students attending postsecondary schools, health care workers, or international travelers. People who received inactivated measles vaccine or an unknown type of measles vaccine during 1963-1967 should receive 2 doses of MMR vaccine. People who received inactivated mumps vaccine or an unknown type of mumps vaccine before 1979 and are at high risk for mumps infection should consider immunization with 2 doses of MMR vaccine. For females of childbearing age, rubella immunity should be determined. If there is no evidence of immunity, females who are not pregnant should be vaccinated. If there is no evidence of immunity, females who are pregnant should delay immunization until after pregnancy. Unvaccinated health care workers born before 1957 who lack laboratory evidence of measles, mumps, or rubella immunity or laboratory confirmation of disease should consider measles and mumps immunization with 2 doses of MMR vaccine or rubella immunization with 1 dose of MMR vaccine.  Pneumococcal 13-valent conjugate (PCV13) vaccine. When indicated, a person who is uncertain of his immunization history and has no record of immunization should receive the PCV13 vaccine. All adults 65 years of age and older should receive this  vaccine. An adult aged 19 years or older who has certain medical conditions and has not been previously immunized should receive 1 dose of PCV13 vaccine. This PCV13 should be followed with a dose of pneumococcal polysaccharide (PPSV23) vaccine. Adults who are at high risk for pneumococcal disease should obtain the PPSV23 vaccine at least 8 weeks after the dose of PCV13 vaccine. Adults older than 29 years of age who have normal immune system function should obtain the PPSV23 vaccine dose at least 1 year after the dose of PCV13 vaccine.  Pneumococcal polysaccharide (PPSV23) vaccine. When PCV13 is also indicated, PCV13 should be obtained first. All adults aged 65 years and older should be immunized. An adult younger than age 65 years who has certain medical conditions should be immunized. Any person who resides in a nursing home or long-term care facility should be immunized. An adult smoker should be immunized. People with an immunocompromised condition and certain other conditions should receive both PCV13 and PPSV23 vaccines. People with human immunodeficiency virus (HIV) infection should be immunized as soon as possible after diagnosis. Immunization during chemotherapy or radiation therapy should be avoided. Routine use of PPSV23 vaccine is not recommended for American Indians, [place] Natives, or people younger than 65 years unless there are medical conditions that require PPSV23 vaccine. When indicated, people who have unknown immunization and have no record of immunization should receive PPSV23 vaccine. One-time revaccination 5 years after the first dose of PPSV23 is recommended for people aged 19-64 years who have chronic kidney failure, nephrotic syndrome, asplenia, or immunocompromised conditions. People who received 1-2 doses of PPSV23 before age 65 years should receive another dose of PPSV23 vaccine at age 65 years or later if at least 5 years have passed since the previous dose. Doses of PPSV23 are not  needed for people immunized with PPSV23 at or after age 65 years.  Meningococcal vaccine. Adults with asplenia or persistent complement component deficiencies should receive 2 doses of quadrivalent meningococcal conjugate (MenACWY-D) vaccine. The doses should be obtained   at least 2 months apart. Microbiologists working with certain meningococcal bacteria, [place] recruits, people at risk during an outbreak, and people who travel to or live in countries with a high rate of meningitis should be immunized. A first-year college student up through age 34 years who is living in a residence hall should receive a dose if she did not receive a dose on or after her 16th birthday. Adults who have certain high-risk conditions should receive one or more doses of vaccine.  Hepatitis A vaccine. Adults who wish to be protected from this disease, have certain high-risk conditions, work with hepatitis A-infected animals, work in hepatitis A research labs, or travel to or work in countries with a high rate of hepatitis A should be immunized. Adults who were previously unvaccinated and who anticipate close contact with an international adoptee during the first 60 days after arrival in the [place] from a country with a high rate of hepatitis A should be immunized.  Hepatitis B vaccine. Adults who wish to be protected from this disease, have certain high-risk conditions, may be exposed to blood or other infectious body fluids, are household contacts or sex partners of hepatitis B positive people, are clients or workers in certain care facilities, or travel to or work in countries with a high rate of hepatitis B should be immunized.  Haemophilus influenzae type b (Hib) vaccine. A previously unvaccinated person with asplenia or sickle cell disease or having a scheduled splenectomy should receive 1 dose of Hib vaccine. Regardless of previous immunization, a recipient of a hematopoietic stem cell transplant should receive a  3-dose series 6-12 months after her successful transplant. Hib vaccine is not recommended for adults with HIV infection. Preventive Services / Frequency Ages 35 to 4 years  Blood pressure check.** / Every 3-5 years.  Lipid and cholesterol check.** / Every 5 years beginning at age 60.  Clinical breast exam.** / Every 3 years for women in their 71s and 10s.  BRCA-related cancer risk assessment.** / For women who have family members with a BRCA-related cancer (breast, ovarian, tubal, or peritoneal cancers).  Pap test.** / Every 2 years from ages 76 through 26. Every 3 years starting at age 61 through age 76 or 93 with a history of 3 consecutive normal Pap tests.  HPV screening.** / Every 3 years from ages 37 through ages 60 to 51 with a history of 3 consecutive normal Pap tests.  Hepatitis C blood test.** / For any individual with known risks for hepatitis C.  Skin self-exam. / Monthly.  Influenza vaccine. / Every year.  Tetanus, diphtheria, and acellular pertussis (Tdap, Td) vaccine.** / Consult your health care provider. Pregnant women should receive 1 dose of Tdap vaccine during each pregnancy. 1 dose of Td every 10 years.  Varicella vaccine.** / Consult your health care provider. Pregnant females who do not have evidence of immunity should receive the first dose after pregnancy.  HPV vaccine. / 3 doses over 6 months, if 93 and younger. The vaccine is not recommended for use in pregnant females. However, pregnancy testing is not needed before receiving a dose.  Measles, mumps, rubella (MMR) vaccine.** / You need at least 1 dose of MMR if you were born in 1957 or later. You may also need a 2nd dose. For females of childbearing age, rubella immunity should be determined. If there is no evidence of immunity, females who are not pregnant should be vaccinated. If there is no evidence of immunity, females who are  pregnant should delay immunization until after pregnancy.  Pneumococcal  13-valent conjugate (PCV13) vaccine.** / Consult your health care provider.  Pneumococcal polysaccharide (PPSV23) vaccine.** / 1 to 2 doses if you smoke cigarettes or if you have certain conditions.  Meningococcal vaccine.** / 1 dose if you are age 51 to 40 years and a Market researcher living in a residence hall, or have one of several medical conditions, you need to get vaccinated against meningococcal disease. You may also need additional booster doses.  Hepatitis A vaccine.** / Consult your health care provider.  Hepatitis B vaccine.** / Consult your health care provider.  Haemophilus influenzae type b (Hib) vaccine.** / Consult your health care provider. Ages 28 to 39 years  Blood pressure check.** / Every year.  Lipid and cholesterol check.** / Every 5 years beginning at age 37 years.  Lung cancer screening. / Every year if you are aged 91-80 years and have a 30-pack-year history of smoking and currently smoke or have quit within the past 15 years. Yearly screening is stopped once you have quit smoking for at least 15 years or develop a health problem that would prevent you from having lung cancer treatment.  Clinical breast exam.** / Every year after age 52 years.  BRCA-related cancer risk assessment.** / For women who have family members with a BRCA-related cancer (breast, ovarian, tubal, or peritoneal cancers).  Mammogram.** / Every year beginning at age 30 years and continuing for as long as you are in good health. Consult with your health care provider.  Pap test.** / Every 3 years starting at age 51 years through age 55 or 33 years with a history of 3 consecutive normal Pap tests.  HPV screening.** / Every 3 years from ages 24 years through ages 78 to 31 years with a history of 3 consecutive normal Pap tests.  Fecal occult blood test (FOBT) of stool. / Every year beginning at age 14 years and continuing until age 5 years. You may not need to do this test if you get  a colonoscopy every 10 years.  Flexible sigmoidoscopy or colonoscopy.** / Every 5 years for a flexible sigmoidoscopy or every 10 years for a colonoscopy beginning at age 75 years and continuing until age 54 years.  Hepatitis C blood test.** / For all people born from [birth year] through 1964 and any individual with known risks for hepatitis C.  Skin self-exam. / Monthly.  Influenza vaccine. / Every year.  Tetanus, diphtheria, and acellular pertussis (Tdap/Td) vaccine.** / Consult your health care provider. Pregnant women should receive 1 dose of Tdap vaccine during each pregnancy. 1 dose of Td every 10 years.  Varicella vaccine.** / Consult your health care provider. Pregnant females who do not have evidence of immunity should receive the first dose after pregnancy.  Zoster vaccine.** / 1 dose for adults aged 67 years or older.  Measles, mumps, rubella (MMR) vaccine.** / You need at least 1 dose of MMR if you were born in 1957 or later. You may also need a second dose. For females of childbearing age, rubella immunity should be determined. If there is no evidence of immunity, females who are not pregnant should be vaccinated. If there is no evidence of immunity, females who are pregnant should delay immunization until after pregnancy.  Pneumococcal 13-valent conjugate (PCV13) vaccine.** / Consult your health care provider.  Pneumococcal polysaccharide (PPSV23) vaccine.** / 1 to 2 doses if you smoke cigarettes or if you have certain conditions.  Meningococcal vaccine.** /  Consult your health care provider.  Hepatitis A vaccine.** / Consult your health care provider.  Hepatitis B vaccine.** / Consult your health care provider.  Haemophilus influenzae type b (Hib) vaccine.** / Consult your health care provider. Ages 64 years and over  Blood pressure check.** / Every year.  Lipid and cholesterol check.** / Every 5 years beginning at age 23 years.  Lung cancer screening. / Every year if you  are aged 16-80 years and have a 30-pack-year history of smoking and currently smoke or have quit within the past 15 years. Yearly screening is stopped once you have quit smoking for at least 15 years or develop a health problem that would prevent you from having lung cancer treatment.  Clinical breast exam.** / Every year after age 74 years.  BRCA-related cancer risk assessment.** / For women who have family members with a BRCA-related cancer (breast, ovarian, tubal, or peritoneal cancers).  Mammogram.** / Every year beginning at age 44 years and continuing for as long as you are in good health. Consult with your health care provider.  Pap test.** / Every 3 years starting at age 58 years through age 22 or 39 years with 3 consecutive normal Pap tests. Testing can be stopped between 65 and 70 years with 3 consecutive normal Pap tests and no abnormal Pap or HPV tests in the past 10 years.  HPV screening.** / Every 3 years from ages 64 years through ages 70 or 61 years with a history of 3 consecutive normal Pap tests. Testing can be stopped between 65 and 70 years with 3 consecutive normal Pap tests and no abnormal Pap or HPV tests in the past 10 years.  Fecal occult blood test (FOBT) of stool. / Every year beginning at age 40 years and continuing until age 27 years. You may not need to do this test if you get a colonoscopy every 10 years.  Flexible sigmoidoscopy or colonoscopy.** / Every 5 years for a flexible sigmoidoscopy or every 10 years for a colonoscopy beginning at age 7 years and continuing until age 32 years.  Hepatitis C blood test.** / For all people born from [birth year] through 1964 and any individual with known risks for hepatitis C.  Osteoporosis screening.** / A one-time screening for women ages 30 years and over and women at risk for fractures or osteoporosis.  Skin self-exam. / Monthly.  Influenza vaccine. / Every year.  Tetanus, diphtheria, and acellular pertussis (Tdap/Td)  vaccine.** / 1 dose of Td every 10 years.  Varicella vaccine.** / Consult your health care provider.  Zoster vaccine.** / 1 dose for adults aged 35 years or older.  Pneumococcal 13-valent conjugate (PCV13) vaccine.** / Consult your health care provider.  Pneumococcal polysaccharide (PPSV23) vaccine.** / 1 dose for all adults aged 46 years and older.  Meningococcal vaccine.** / Consult your health care provider.  Hepatitis A vaccine.** / Consult your health care provider.  Hepatitis B vaccine.** / Consult your health care provider.  Haemophilus influenzae type b (Hib) vaccine.** / Consult your health care provider. ** Family history and personal history of risk and conditions may change your health care provider's recommendations.   This information is not intended to replace advice given to you by your health care provider. Make sure you discuss any questions you have with your health care provider.   Document Released: 03/02/2001 Document Revised: 01/25/2014 Document Reviewed: 06/01/2010 Elsevier Interactive Patient Education [employer].

## 2015-02-12 NOTE — Progress Notes (Signed)
  Subjective:     Renee Skinner is a 30 y.o. female and is here for a comprehensive physical exam. The patient reports problems - cycles have gotten progressively further apart. Last Depo in 1/16 and regular cycles until 9/16, then were 1 week late.  No change in weight.  Social History   Social History  . Marital Status: Single    Spouse Name: N/A  . Number of Children: N/A  . Years of Education: N/A   Occupational History  . Not on file.   Social History Main Topics  . Smoking status: Never Smoker   . Smokeless tobacco: Never Used  . Alcohol Use: No  . Drug Use: No  . Sexual Activity:    Partners: Male    Birth Control/ Protection: None   Other Topics Concern  . Not on file   Social History Narrative   Health Maintenance  Topic Date Due  . PAP SMEAR  04/19/2014  . INFLUENZA VACCINE  08/19/2014  . TETANUS/TDAP  08/09/2021  . HIV Screening  Completed    The following portions of the patient's history were reviewed and updated as appropriate: allergies, current medications, past family history, past medical history, past social history, past surgical history and problem list.  Review of Systems Pertinent items noted in HPI and remainder of comprehensive ROS otherwise negative.   Objective:    BP 117/80 mmHg  Pulse 83  Ht  (1.575 m)  Wt 223 lb (101.152 kg)  BMI 40.78 kg/m2  LMP 02/05/2015 General appearance: alert, cooperative, appears stated age and moderately obese Head: Normocephalic, without obvious abnormality, atraumatic Neck: no adenopathy, supple, symmetrical, trachea midline and thyroid not enlarged, symmetric, no tenderness/mass/nodules Lungs: clear to auscultation bilaterally Breasts: normal appearance, no masses or tenderness Heart: regular rate and rhythm, S1, S2 normal, no murmur, click, rub or gallop Abdomen: soft, non-tender; bowel sounds normal; no masses,  no organomegaly Pelvic: cervix normal in appearance, external genitalia normal, no  adnexal masses or tenderness, no cervical motion tenderness, uterus normal size, shape, and consistency and vagina normal without discharge Extremities: extremities normal, atraumatic, no cyanosis or edema Pulses: 2+ and symmetric Skin: Skin color, texture, turgor normal. No rashes or lesions Lymph nodes: Cervical, supraclavicular, and axillary nodes normal. Neurologic: Grossly normal    Assessment:    Healthy female exam.      Plan:     1. Screening for malignant neoplasm of cervix  - Cytology - PAP  2. Encounter for routine gynecological examination  - TSH - CBC - Comprehensive metabolic panel - Lipid panel  3. On Depo-Provera for contraception  - POCT urine pregnancy - medroxyPROGESTERone (DEPO-PROVERA) injection 150 mg; Inject 1 mL (150 mg total) into the muscle once.  See After Visit Summary for Counseling Recommendations

## 2015-02-13 LAB — TSH: TSH: 0.343 u[IU]/mL — ABNORMAL LOW (ref 0.350–4.500)

## 2015-02-13 LAB — T3, FREE: T3 FREE: 3.6 pg/mL (ref 2.3–4.2)

## 2015-02-13 LAB — T4, FREE: FREE T4: 1.05 ng/dL (ref 0.80–1.80)

## 2015-02-14 LAB — CYTOLOGY - PAP

## 2015-02-18 ENCOUNTER — Telehealth: Payer: Self-pay | Admitting: *Deleted

## 2015-02-18 NOTE — Telephone Encounter (Signed)
-----   Message from Olevia Bowens sent at 02/18/2015  3:23 PM EST ----- Regarding: Lab Results Contact: [phone] Called yesterday, still waiting a response, wants labs results from visit

## 2015-02-18 NOTE — Telephone Encounter (Signed)
Informed pt of normal T3 and T4 result and recommendation to follow-up for rpt TSH in 6 months, pt acknowledged.

## 2015-02-18 NOTE — Telephone Encounter (Signed)
-----   Message from Jacinda S Battle sent at 02/18/2015  3:23 PM EST ----- Regarding: Lab Results Contact: [phone] Called yesterday, still waiting a response, wants labs results from visit 

## 2015-04-16 ENCOUNTER — Ambulatory Visit (INDEPENDENT_AMBULATORY_CARE_PROVIDER_SITE_OTHER): Payer: Commercial Managed Care - HMO | Admitting: *Deleted

## 2015-04-16 DIAGNOSIS — Z3042 Encounter for surveillance of injectable contraceptive: Secondary | ICD-10-CM | POA: Diagnosis not present

## 2015-07-02 ENCOUNTER — Ambulatory Visit: Payer: Commercial Managed Care - HMO

## 2015-07-15 ENCOUNTER — Ambulatory Visit (INDEPENDENT_AMBULATORY_CARE_PROVIDER_SITE_OTHER): Payer: Commercial Managed Care - HMO | Admitting: *Deleted

## 2015-07-15 DIAGNOSIS — Z3042 Encounter for surveillance of injectable contraceptive: Secondary | ICD-10-CM

## 2015-07-15 NOTE — Progress Notes (Signed)
Pt here today for Depo Provera 150 mg injection, last injection was given on 04/16/2015.

## 2015-09-30 ENCOUNTER — Ambulatory Visit (INDEPENDENT_AMBULATORY_CARE_PROVIDER_SITE_OTHER): Payer: Commercial Managed Care - HMO | Admitting: *Deleted

## 2015-09-30 DIAGNOSIS — Z3042 Encounter for surveillance of injectable contraceptive: Secondary | ICD-10-CM | POA: Diagnosis not present

## 2015-10-14 ENCOUNTER — Ambulatory Visit: Payer: Commercial Managed Care - HMO | Admitting: Obstetrics & Gynecology

## 2015-10-14 DIAGNOSIS — R350 Frequency of micturition: Secondary | ICD-10-CM

## 2015-12-16 ENCOUNTER — Ambulatory Visit (INDEPENDENT_AMBULATORY_CARE_PROVIDER_SITE_OTHER): Payer: Commercial Managed Care - HMO | Admitting: *Deleted

## 2015-12-16 DIAGNOSIS — Z3042 Encounter for surveillance of injectable contraceptive: Secondary | ICD-10-CM

## 2015-12-16 NOTE — Progress Notes (Signed)
Pt is here today for Depo Provera 150mg  injection, last injection was given on 09-30-15.  Depo Provera given.

## 2016-02-24 ENCOUNTER — Ambulatory Visit: Payer: Commercial Managed Care - HMO

## 2016-03-02 ENCOUNTER — Ambulatory Visit (INDEPENDENT_AMBULATORY_CARE_PROVIDER_SITE_OTHER): Payer: Commercial Managed Care - HMO | Admitting: *Deleted

## 2016-03-02 ENCOUNTER — Ambulatory Visit: Payer: Commercial Managed Care - HMO | Admitting: Family Medicine

## 2016-03-02 DIAGNOSIS — Z3042 Encounter for surveillance of injectable contraceptive: Secondary | ICD-10-CM | POA: Diagnosis not present

## 2016-03-02 MED ORDER — MEDROXYPROGESTERONE ACETATE 150 MG/ML IM SUSP: 150.0000 mg | INTRAMUSCULAR | 3 refills | Status: DC

## 2016-03-02 NOTE — Progress Notes (Signed)
Pt here today for Depo Provera 150mg  injection, Depo given.

## 2016-03-30 ENCOUNTER — Ambulatory Visit: Payer: Commercial Managed Care - HMO | Admitting: Family Medicine

## 2016-05-13 ENCOUNTER — Ambulatory Visit: Payer: Commercial Managed Care - HMO

## 2016-06-09 ENCOUNTER — Telehealth: Payer: Self-pay | Admitting: *Deleted

## 2016-06-09 NOTE — Telephone Encounter (Signed)
Returned pt call to discuss options, no answer, left message to call the office.

## 2016-06-09 NOTE — Telephone Encounter (Signed)
-----   Message from Lindell SparHeather L Bacon, [place] sent at 06/09/2016 10:35 AM EDT ----- Regarding: stop depo/ start pills Contact: [phone] Please call pt back to discuss starting pills, [facility] before.

## 2016-06-18 ENCOUNTER — Telehealth: Payer: Self-pay | Admitting: *Deleted

## 2016-06-18 DIAGNOSIS — Z30011 Encounter for initial prescription of contraceptive pills: Secondary | ICD-10-CM

## 2016-06-18 MED ORDER — NORGESTIMATE-ETH ESTRADIOL 0.25-35 MG-MCG PO TABS: 1.0000 | ORAL_TABLET | Freq: Every day | ORAL | 4 refills | Status: DC

## 2016-06-18 NOTE — Telephone Encounter (Signed)
Pt would like to discontinue the use of Depo Provera for contraception and start OCPs.  Spoke to Dr Macon LargeAnyanwu about pt request, approved Sprintec to be sent to pharmacy.  Pt has denied any unprotected intercourse in the past 2 weeks.  Instructed to take at home UPT and if negative she could start the pills on Sunday.  Pt acknowledged instructions.

## 2016-11-19 ENCOUNTER — Emergency Department (HOSPITAL_BASED_OUTPATIENT_CLINIC_OR_DEPARTMENT_OTHER)
Admission: EM | Admit: 2016-11-19 | Discharge: 2016-11-19 | Disposition: A | Discharge status: Home / Self Care | Payer: 59 | Attending: Emergency Medicine | Admitting: Emergency Medicine

## 2016-11-19 ENCOUNTER — Emergency Department (HOSPITAL_BASED_OUTPATIENT_CLINIC_OR_DEPARTMENT_OTHER): Payer: 59

## 2016-11-19 ENCOUNTER — Encounter (HOSPITAL_BASED_OUTPATIENT_CLINIC_OR_DEPARTMENT_OTHER): Payer: Self-pay | Admitting: *Deleted

## 2016-11-19 DIAGNOSIS — R51 Headache: Secondary | ICD-10-CM | POA: Insufficient documentation

## 2016-11-19 DIAGNOSIS — R519 Headache, unspecified: Secondary | ICD-10-CM

## 2016-11-19 DIAGNOSIS — Z79899 Other long term (current) drug therapy: Secondary | ICD-10-CM | POA: Insufficient documentation

## 2016-11-19 LAB — CBC WITH DIFFERENTIAL/PLATELET
Basophils Absolute: 0 10*3/uL (ref 0.0–0.1)
Basophils Relative: 0 %
EOS ABS: 0.1 10*3/uL (ref 0.0–0.7)
EOS PCT: 1 %
HCT: 36.5 % (ref 36.0–46.0)
HEMOGLOBIN: 11.8 g/dL — AB (ref 12.0–15.0)
LYMPHS ABS: 5 10*3/uL — AB (ref 0.7–4.0)
LYMPHS PCT: 45 %
MCH: 26.4 pg (ref 26.0–34.0)
MCHC: 32.3 g/dL (ref 30.0–36.0)
MCV: 81.7 fL (ref 78.0–100.0)
MONOS PCT: 7 %
Monocytes Absolute: 0.7 10*3/uL (ref 0.1–1.0)
Neutro Abs: 5.3 10*3/uL (ref 1.7–7.7)
Neutrophils Relative %: 47 %
PLATELETS: 362 10*3/uL (ref 150–400)
RBC: 4.47 MIL/uL (ref 3.87–5.11)
RDW: 15 % (ref 11.5–15.5)
WBC: 11.1 10*3/uL — ABNORMAL HIGH (ref 4.0–10.5)

## 2016-11-19 LAB — BASIC METABOLIC PANEL
Anion gap: 7 (ref 5–15)
BUN: 12 mg/dL (ref 6–20)
CHLORIDE: 105 mmol/L (ref 101–111)
CO2: 25 mmol/L (ref 22–32)
CREATININE: 1 mg/dL (ref 0.44–1.00)
Calcium: 9.1 mg/dL (ref 8.9–10.3)
GFR calc Af Amer: >60 mL/min (ref >60)
GFR calc non Af Amer: >60 mL/min (ref >60)
Glucose, Bld: 97 mg/dL (ref 65–99)
POTASSIUM: 3.6 mmol/L (ref 3.5–5.1)
Sodium: 137 mmol/L (ref 135–145)

## 2016-11-19 LAB — HCG, SERUM, QUALITATIVE: PREG SERUM: NEGATIVE

## 2016-11-19 MED ORDER — TRAMADOL HCL 50 MG PO TABS: 50.0000 mg | ORAL_TABLET | Freq: Four times a day (QID) | ORAL | 0 refills | Status: DC | PRN

## 2016-11-19 MED ORDER — METOCLOPRAMIDE HCL 10 MG PO TABS
10.0000 mg | ORAL_TABLET | Freq: Once | ORAL | Status: AC
  Administered 2016-11-19: 10 mg via ORAL
  Filled 2016-11-19: qty 1

## 2016-11-19 MED ORDER — DIPHENHYDRAMINE HCL 25 MG PO CAPS
25.0000 mg | ORAL_CAPSULE | Freq: Once | ORAL | Status: AC
  Administered 2016-11-19: 25 mg via ORAL
  Filled 2016-11-19: qty 1

## 2016-11-19 MED ORDER — LIDOCAINE HCL (PF) 1 % IJ SOLN
15.0000 mL | Freq: Once | INTRAMUSCULAR | Status: DC
Start: 2016-11-19 — End: 2016-11-19

## 2016-11-19 MED ORDER — LIDOCAINE HCL 1 % IJ SOLN
INTRAMUSCULAR | Status: AC
  Filled 2016-11-19: qty 20

## 2016-11-19 MED ORDER — KETOROLAC TROMETHAMINE 30 MG/ML IJ SOLN
30.0000 mg | Freq: Once | INTRAMUSCULAR | Status: DC
  Filled 2016-11-19: qty 1

## 2016-11-19 MED ORDER — ONDANSETRON HCL 4 MG PO TABS: 4.0000 mg | ORAL_TABLET | Freq: Three times a day (TID) | ORAL | 0 refills | Status: DC | PRN

## 2016-11-19 MED ORDER — KETOROLAC TROMETHAMINE 30 MG/ML IJ SOLN
30.0000 mg | Freq: Once | INTRAMUSCULAR | Status: AC
  Administered 2016-11-19: 30 mg via INTRAVENOUS

## 2016-11-19 NOTE — ED Triage Notes (Signed)
Headache for a week. She left work today because the pain has been come everyday for the past week.

## 2016-11-19 NOTE — ED Provider Notes (Signed)
[facility] EMERGENCY DEPARTMENT Provider Note   CSN: [id number] Arrival date & time: 11/19/16  1450     History   [id number] Complaint Chief Complaint  Patient presents with  . Headache    HPI Renee Skinner is a 31 y.o. female who presents to the ED today for HA x 8 days. Patient states that 8 days ago, while at work ([employer]), she had the gradual onset of a left sided HA that she describes as pressure and throbbing. She has tried Excedrin for this without relief. The HA has occurred daily and wax and waned over this time. She says it increases at times while at work, and other times wakes her up out of her sleep. At the HA worst it is a 5/10. She states that she does have sleep apnea so at times she will have headaches that wake her up out of her sleep but she has never had headaches during the day.  She says that this is different because normally she only get a headache that lasts a few hours, a couple times a year.  Patient denies any family history of migraines or personal history of migraines. Denies fever, syncope, head trauma, photophobia, phonophobia, N/V, visual changes, stiff neck, neck pain, rash, seizure, jaw claudication, or "thunderclap" onset. Not first HA. Not worst HA of life. No one with similar HA or exposure that would suggest CO poisoning. Pregnancy status unknown.  HPI  Past Medical History:  Diagnosis Date  . Chlamydia   . Cholestasis of pregnancy     Patient Active Problem List   Diagnosis Date Noted  . Depo-Provera contraceptive status 11/09/2013  . Symptomatic cholelithiasis 12/20/2012  . Carpal tunnel syndrome, bilateral 04/19/2011    Past Surgical History:  Procedure Laterality Date  . CHOLECYSTECTOMY    . NO PAST SURGERIES      OB History    Gravida Para Term Preterm AB Living   2 1   1 1 1    SAB TAB Ectopic Multiple Live Births     1 0   1       Home Medications    Prior to Admission medications   Medication Sig Start Date  End Date Taking? Authorizing Provider  medroxyPROGESTERone (DEPO-PROVERA) 150 MG/ML injection Inject 1 mL (150 mg total) into the muscle every 3 (three) months. 03/02/16   Reva BoresPratt, Tanya S, MD  norgestimate-ethinyl estradiol (ORTHO-CYCLEN,SPRINTEC,PREVIFEM) 0.25-35 MG-MCG tablet Take 1 tablet by mouth daily. 06/18/16   Tereso NewcomerAnyanwu, Ugonna A, MD    Family History Family History  Problem Relation Age of Onset  . Cancer Maternal Grandmother        Breast/tn  72 when she passed  . Hypertension Father   . Other Neg Hx     Social History Social History  Substance Use Topics  . Smoking status: Never Smoker  . Smokeless tobacco: Never Used  . Alcohol use No     Allergies   Patient has no known allergies.   Review of Systems Review of Systems  All other systems reviewed and are negative.    Physical Exam Updated Vital Signs BP 126/90   Pulse 90   Temp 98.1 F (36.7 C) (Oral)   Resp 20   Ht 5\' 2"  (1.575 m)   Wt 103.9 kg (229 lb)   LMP 10/29/2016   SpO2 100%   BMI 41.88 kg/m   Physical Exam  Constitutional: She appears well-developed and well-nourished.  HENT:  Head: Normocephalic and atraumatic.  Right Ear: External ear normal.  Left Ear: External ear normal.  Nose: Nose normal.  Mouth/Throat: Uvula is midline, oropharynx is clear and moist and mucous membranes are normal. No tonsillar exudate.  No temporal TTP  Eyes: Pupils are equal, round, and reactive to light. Conjunctivae and EOM are normal. Right eye exhibits no discharge. Left eye exhibits no discharge. No scleral icterus.  No papilledema or AV nicking  Neck: Trachea normal. Neck supple. No spinous process tenderness present. No neck rigidity. Normal range of motion present.  No meningismus.   Cardiovascular: Normal rate, regular rhythm and intact distal pulses.   No murmur heard. Pulses:      Radial pulses are 2+ on the right side, and 2+ on the left side.       Dorsalis pedis pulses are 2+ on the right side, and  2+ on the left side.       Posterior tibial pulses are 2+ on the right side, and 2+ on the left side.  No lower extremity swelling or edema. Calves symmetric in size bilaterally.  Pulmonary/Chest: Effort normal and breath sounds normal. She exhibits no tenderness.  Abdominal: Soft. Bowel sounds are normal. There is no tenderness. There is no rebound and no guarding.  Musculoskeletal: She exhibits no edema.  Lymphadenopathy:    She has no cervical adenopathy.  Neurological: She is alert.  Mental Status:  Alert, oriented, thought content appropriate, able to give a coherent history. Speech fluent without evidence of aphasia. Able to follow 2 step commands without difficulty.  Cranial Nerves:  II:  Peripheral visual fields grossly normal, pupils equal, round, reactive to light III,IV, VI: ptosis not present, extra-ocular motions intact bilaterally  V,VII: smile symmetric, eyebrows raise symmetric, facial light touch sensation equal VIII: hearing grossly normal to voice  X: uvula elevates symmetrically  XI: bilateral shoulder shrug symmetric and strong XII: midline tongue extension without fassiculations Motor:  Normal tone. 5/5 in upper and lower extremities bilaterally including strong and equal grip strength and dorsiflexion/plantar flexion Sensory: Sensation intact to light touch in all extremities. Negative Romberg.  Deep Tendon Reflexes: 2+ and symmetric in the biceps and patella Cerebellar: normal finger-to-nose with bilateral upper extremities. Normal heel-to -shin balance bilaterally of the lower extremity. No pronator drift.  Gait: normal gait and balance CV: distal pulses palpable throughout   Skin: Skin is warm and dry. Capillary refill takes less than 2 seconds. No rash noted. She is not diaphoretic.  Psychiatric: She has a normal mood and affect.  Nursing note and vitals reviewed.    ED Treatments / Results  Labs (all labs ordered are listed, but only abnormal results are  displayed) Labs Reviewed  PREGNANCY, URINE  BASIC METABOLIC PANEL  CBC WITH DIFFERENTIAL/PLATELET    EKG  EKG Interpretation None       Radiology Ct Head Wo Contrast  Result Date: 11/19/2016 CLINICAL DATA:  31 y/o  F; daily headaches for 8 days. EXAM: CT HEAD WITHOUT CONTRAST TECHNIQUE: Contiguous axial images were obtained from the base of the skull through the vertex without intravenous contrast. COMPARISON:  None. FINDINGS: Brain: No evidence of acute infarction, hemorrhage, hydrocephalus, extra-axial collection or mass lesion/mass effect. Vascular: No hyperdense vessel or unexpected calcification. Skull: Normal. Negative for fracture or focal lesion. Sinuses/Orbits: No acute finding. Other: None. IMPRESSION: Normal CT of the head. Electronically Signed   By: Mitzi Hansen M.D.   On: 11/19/2016 17:15    Procedures Procedures (including critical care time)  Medications Ordered  in ED Medications  diphenhydrAMINE (BENADRYL) capsule 25 mg (25 mg Oral Given 11/19/16 1738)  metoCLOPramide (REGLAN) tablet 10 mg (10 mg Oral Given 11/19/16 1738)  ketorolac (TORADOL) 30 MG/ML injection 30 mg (30 mg Intravenous Given 11/19/16 1739)     Initial Impression / Assessment and Plan / ED Course  I have reviewed the triage vital signs and the nursing notes.  Pertinent labs & imaging results that were available during my care of the patient were reviewed by me and considered in my medical decision making (see chart for details).     Mildly overwieght female with daily HA x 8 days. Patient does not have dx of migrainges in the past and no fhx of migraines. She denies fever, syncope, head trauma, photophobia, phonophobia, UL throbbing pain, N/V, visual changes, stiff neck, neck pain, rash, seizure, jaw claudication, or "thunderclap" onset. This is not her first HA and not worst HA of life. Similar to previous headaches in the past but occurring more frequently and now during daytime.  History is not c/w CO poisoning as no other party with similar symptoms. Pregnancy status unknown and will obtain pregnancy test. Patients vitals reassuring and without fever. Exam with no focal neurologic deficits and normal neuro exam. Eye exam is without evidence of papilledema. Patient is without vision changes or temporal TTP. Do not suspect temporal arteritis. No fever or neck stiffness. Do not suspect meningitis. There is concern for pseudotumor cerebri as the patient is overweight and has daily headaches. Will obtain CT scan first and then evaluate with LP. Will treat patient with Toradol, Reglan and Benadryl.   Pregnancy test negative. CT negative. Patient HA resolved. I offered LP to the patient but she states she would prefer to try conservative measures at home and follow up with neurology if symptoms persist. I provided her with referral. Strict return precautions discussed. Patient is in agreement with plan and appears safe for discharge. Patient case discussed with Dr. Eudelia Bunch who is in agreement with plan.  Final Clinical Impressions(s) / ED Diagnoses   Final diagnoses:  Bad headache    New Prescriptions Discharge Medication List as of 11/19/2016  7:22 PM    START taking these medications   Details  ondansetron (ZOFRAN) 4 MG tablet Take 1 tablet (4 mg total) by mouth every 8 (eight) hours as needed for nausea or vomiting., Starting Fri 11/19/2016, Print    traMADol (ULTRAM) 50 MG tablet Take 1 tablet (50 mg total) by mouth every 6 (six) hours as needed., Starting Fri 11/19/2016, Print         Jacinto Halim, PA-C 11/20/16 1620    Nira Conn, MD 11/21/16 [phone]

## 2016-11-19 NOTE — Discharge Instructions (Signed)
Please read and follow all provided instructions.  Your diagnoses today include:  1. Bad headache     Tests performed today include: CT of your head which was normal and did not show any serious cause of your headache Blood work - reassuring  Pregnancy - negative  Vital signs. See below for your results today.   Medications:  In the Emergency Department you received: Reglan - antinausea/headache medication Benadryl - antihistamine to counteract potential side effects of reglan Toradol - NSAID medication similar to ibuprofen  For pain control you may take: 800mg  of ibuprofen (that is usually four 200mg  over the counter pills) up to 3 times a day (please take with food) and acetaminophen 975mg  (this is 3 normal strength, 325mg , over the counter pills) up to four times a day. Please do not take more than this. Do not drink alcohol or combine with other medications that have acetaminophen or Ibuprofen as an ingredient (Read the labels!).    For breakthrough pain you may take Ultram. Do not drink alcohol drive or operate heavy machinery when taking. You are being provided a prescription for opiates (also known as narcotics) for pain control on an ?as needed? basis.  Opiates can be addictive and should only be used when absolutely necessary for pain control when other alternatives do not work.  We recommend you only use them for the recommended amount of time and only as prescribed.  Please do not take with other sedative medications or alcohol.  Please do not drive, operate machinery, or make important decisions while taking opiates.  Please note that these medications can be addictive and have high abuse potential.  Please keep these medications locked away from children, teenagers or any family members with history of substance abuse. Additionally, these medications may cause constipation - take over the counter stool softeners or add fiber to your diet to treat this (Metamucil, Psyllium Fiber,  Colace, Miralax) Further refills will need to be obtained from your primary care doctor and will not be prescribed through the Emergency Department. You will test positive on most drug tests while taking this medication.   We offered you to have a lumbar puncture today in the ED. You wished to follow up with neurology if your symptoms persisted and so you could be evaluated for this further at that time.    Follow-up instructions: Please follow-up with your primary care provider in the next 3 days for further evaluation of your symptoms.  These follow-up with neurology if you continue to have headaches.  Return instructions:  Please return to the Emergency Department if you experience worsening symptoms. Return if the medications do not resolve your headache, if it recurs, or if you have multiple episodes of vomiting or cannot keep down fluids. Return if you have a change from the usual headache. RETURN IMMEDIATELY IF you: Develop a sudden, severe headache Develop confusion or become poorly responsive or faint Develop a fever above 100.14F or problem breathing Have a change in speech, vision, swallowing, or understanding Develop new weakness, numbness, tingling, incoordination in your arms or legs Have a seizure Please return if you have any other emergent concerns.  Additional Information:  Your vital signs today were: BP (!) 105/53    Pulse 88    Temp 98.1 F (36.7 C) (Oral)    Resp 18    Ht 5\' 2"  (1.575 m)    Wt 103.9 kg (229 lb)    LMP 10/29/2016    SpO2 100%  BMI 41.88 kg/m  If your blood pressure (BP) was elevated above 135/85 this visit, please have this repeated by your doctor within one month. --------------

## 2016-11-19 NOTE — ED Notes (Signed)
Patient transported to CT 

## 2016-11-19 NOTE — ED Notes (Signed)
Pt verbalizes understanding of d/c instructions and denies any further needs at this time. 

## 2016-11-22 ENCOUNTER — Encounter (HOSPITAL_BASED_OUTPATIENT_CLINIC_OR_DEPARTMENT_OTHER): Payer: Self-pay | Admitting: *Deleted

## 2016-11-22 ENCOUNTER — Emergency Department (HOSPITAL_BASED_OUTPATIENT_CLINIC_OR_DEPARTMENT_OTHER)
Admission: EM | Admit: 2016-11-22 | Discharge: 2016-11-22 | Disposition: A | Discharge status: Home / Self Care | Payer: 59 | Attending: Emergency Medicine | Admitting: Emergency Medicine

## 2016-11-22 ENCOUNTER — Other Ambulatory Visit: Payer: Self-pay

## 2016-11-22 DIAGNOSIS — R51 Headache: Secondary | ICD-10-CM | POA: Diagnosis present

## 2016-11-22 DIAGNOSIS — R519 Headache, unspecified: Secondary | ICD-10-CM

## 2016-11-22 DIAGNOSIS — Z79899 Other long term (current) drug therapy: Secondary | ICD-10-CM | POA: Insufficient documentation

## 2016-11-22 MED ORDER — DIPHENHYDRAMINE HCL 50 MG/ML IJ SOLN
25.0000 mg | Freq: Once | INTRAMUSCULAR | Status: AC
  Administered 2016-11-22: 25 mg via INTRAVENOUS
  Filled 2016-11-22: qty 1

## 2016-11-22 MED ORDER — SODIUM CHLORIDE 0.9 % IV BOLUS (SEPSIS)
1000.0000 mL | Freq: Once | INTRAVENOUS | Status: AC
  Administered 2016-11-22: 1000 mL via INTRAVENOUS

## 2016-11-22 MED ORDER — KETOROLAC TROMETHAMINE 30 MG/ML IJ SOLN
30.0000 mg | Freq: Once | INTRAMUSCULAR | Status: AC
  Administered 2016-11-22: 30 mg via INTRAVENOUS
  Filled 2016-11-22: qty 1

## 2016-11-22 MED ORDER — METOCLOPRAMIDE HCL 5 MG/ML IJ SOLN
10.0000 mg | Freq: Once | INTRAMUSCULAR | Status: AC
  Administered 2016-11-22: 10 mg via INTRAVENOUS
  Filled 2016-11-22: qty 2

## 2016-11-22 MED ORDER — BUTALBITAL-APAP-CAFFEINE 50-325-40 MG PO TABS: 1.0000 | ORAL_TABLET | Freq: Four times a day (QID) | ORAL | 0 refills | Status: DC | PRN

## 2016-11-22 MED FILL — BUTALB-ACETAMIN-CAFF 50-325: 50-325-40 | 4 days supply | Qty: 15 | Fill #0

## 2016-11-22 NOTE — Discharge Instructions (Signed)
Please read and follow all provided instructions.  Your diagnoses today include:  1. Acute nonintractable headache, unspecified headache type     Tests performed today include: Vital signs. See below for your results today.   Medications:  In the Emergency Department you received: Reglan - antinausea/headache medication Benadryl - antihistamine to counteract potential side effects of reglan Toradol - NSAID medication similar to ibuprofen  Take any prescribed medications only as directed.  Additional information:  Follow any educational materials contained in this packet.  You are having a headache. No specific cause was found today for your headache. It may have been a migraine or other cause of headache. Stress, anxiety, fatigue, and depression are common triggers for headaches.   Your headache today does not appear to be life-threatening or require hospitalization, but often the exact cause of headaches is not determined in the emergency department. Therefore, follow-up with your doctor is very important to find out what may have caused your headache and whether or not you need any further diagnostic testing or treatment.   Sometimes headaches can appear benign (not harmful), but then more serious symptoms can develop which should prompt an immediate re-evaluation by your doctor or the emergency department.  BE VERY CAREFUL not to take multiple medicines containing Tylenol (also called acetaminophen). Doing so can lead to an overdose which can damage your liver and cause liver failure and possibly death.   Follow-up instructions: Please follow-up with your primary care provider in the next 3 days for further evaluation of your symptoms.   Return instructions:  Please return to the Emergency Department if you experience worsening symptoms. Return if the medications do not resolve your headache, if it recurs, or if you have multiple episodes of vomiting or cannot keep down  fluids. Return if you have a change from the usual headache. RETURN IMMEDIATELY IF you: Develop a sudden, severe headache Develop confusion or become poorly responsive or faint Develop a fever above 100.80F or problem breathing Have a change in speech, vision, swallowing, or understanding Develop new weakness, numbness, tingling, incoordination in your arms or legs Have a seizure Please return if you have any other emergent concerns.  Additional Information:  Your vital signs today were: BP 132/82 (BP Location: Right Arm)    Pulse 95    Temp 98.4 F (36.9 C) (Oral)    Resp 18    Ht 5\' 2"  (1.575 m)    Wt 103.9 kg (229 lb)    LMP 10/29/2016    SpO2 100%    BMI 41.88 kg/m  If your blood pressure (BP) was elevated above 135/85 this visit, please have this repeated by your doctor within one month. --------------

## 2016-11-22 NOTE — ED Triage Notes (Signed)
Seen here 2 days ago for headache. States can't take tramadol because it makes her dizzy. Called [facility] to schedule f/u for lumbar puncture and was told they don't do those so came back to ED for recheck

## 2016-11-22 NOTE — ED Provider Notes (Signed)
[facility] EMERGENCY DEPARTMENT Provider Note   CSN: [id number] Arrival date & time: 11/22/16  0901     History   Chief Complaint Chief Complaint  Patient presents with  . Headache    recheck    HPI Renee Skinner is a 31 y.o. female.  HPI  31 y.o. female, presents to the Emergency Department today due to headache x 10 days. Pt seen in ED x 2 days ago. Labs and imaging unremarkable. Given migraine cocktail with relief. Outpatient Neuro follow up given. Pt returns due to tramadol that was given made her dizzy and that her headache has returned. Pt states headache is unilateral on left side. Describes as throbbing and rates pain 6/10. Intermittent. Notes phonophobia and photophobia. No visual changes. No blurred vision or loss of vision. No CP/SOB/ABD pain. No neck stiffness. No fevers. No gait abnormality. No urinary incontinence. No other symptoms noted.    Past Medical History:  Diagnosis Date  . Chlamydia   . Cholestasis of pregnancy     Patient Active Problem List   Diagnosis Date Noted  . Depo-Provera contraceptive status 11/09/2013  . Symptomatic cholelithiasis 12/20/2012  . Carpal tunnel syndrome, bilateral 04/19/2011    Past Surgical History:  Procedure Laterality Date  . CHOLECYSTECTOMY    . NO PAST SURGERIES      OB History    Gravida Para Term Preterm AB Living   2 1   1 1 1    SAB TAB Ectopic Multiple Live Births     1 0   1       Home Medications    Prior to Admission medications   Medication Sig Start Date End Date Taking? Authorizing Provider  ondansetron (ZOFRAN) 4 MG tablet Take 1 tablet (4 mg total) by mouth every 8 (eight) hours as needed for nausea or vomiting. 11/19/16  Yes Maczis, Elmer Sow, PA-C  medroxyPROGESTERone (DEPO-PROVERA) 150 MG/ML injection Inject 1 mL (150 mg total) into the muscle every 3 (three) months. 03/02/16   Reva Bores, MD  norgestimate-ethinyl estradiol (ORTHO-CYCLEN,SPRINTEC,PREVIFEM) 0.25-35 MG-MCG tablet  Take 1 tablet by mouth daily. 06/18/16   Anyanwu, Jethro Bastos, MD  traMADol (ULTRAM) 50 MG tablet Take 1 tablet (50 mg total) by mouth every 6 (six) hours as needed. 11/19/16   Maczis, Elmer Sow, PA-C    Family History Family History  Problem Relation Age of Onset  . Cancer Maternal Grandmother        Breast/tn  72 when she passed  . Hypertension Father   . Other Neg Hx     Social History Social History   Tobacco Use  . Smoking status: Never Smoker  . Smokeless tobacco: Never Used  Substance Use Topics  . Alcohol use: No  . Drug use: No     Allergies   Patient has no known allergies.   Review of Systems Review of Systems ROS reviewed and all are negative for acute change except as noted in the HPI.  Physical Exam Updated Vital Signs BP 132/82 (BP Location: Right Arm)   Pulse 95   Temp 98.4 F (36.9 C) (Oral)   Resp 18   Ht 5\' 2"  (1.575 m)   Wt 103.9 kg (229 lb)   LMP 10/29/2016   SpO2 100%   BMI 41.88 kg/m   Physical Exam  Constitutional: She is oriented to person, place, and time. She appears well-developed and well-nourished. No distress.  HENT:  Head: Normocephalic and atraumatic.  Right Ear: Tympanic membrane,  external ear and ear canal normal.  Left Ear: Tympanic membrane, external ear and ear canal normal.  Nose: Nose normal.  Mouth/Throat: Uvula is midline, oropharynx is clear and moist and mucous membranes are normal. No trismus in the jaw. No oropharyngeal exudate, posterior oropharyngeal erythema or tonsillar abscesses.  Eyes: EOM are normal. Pupils are equal, round, and reactive to light.  Neck: Normal range of motion. Neck supple. No tracheal deviation present.  Cardiovascular: Normal rate, regular rhythm, S1 normal, S2 normal, normal heart sounds, intact distal pulses and normal pulses.  Pulmonary/Chest: Effort normal and breath sounds normal. No respiratory distress. She has no decreased breath sounds. She has no wheezes. She has no rhonchi. She has  no rales.  Abdominal: Normal appearance and bowel sounds are normal. There is no tenderness.  Musculoskeletal: Normal range of motion.  Neurological: She is alert and oriented to person, place, and time. She has normal strength. No cranial nerve deficit or sensory deficit.  Cranial Nerves:  II: Pupils equal, round, reactive to light III,IV, VI: ptosis not present, extra-ocular motions intact bilaterally  V,VII: smile symmetric, facial light touch sensation equal VIII: hearing grossly normal bilaterally  IX,X: midline uvula rise  XI: bilateral shoulder shrug equal and strong XII: midline tongue extension  Skin: Skin is warm and dry.  Psychiatric: She has a normal mood and affect. Her speech is normal and behavior is normal. Thought content normal.  Nursing note and vitals reviewed.    ED Treatments / Results  Labs (all labs ordered are listed, but only abnormal results are displayed) Labs Reviewed - No data to display  EKG  EKG Interpretation None       Radiology No results found.  Procedures Procedures (including critical care time)  Medications Ordered in ED Medications  sodium chloride 0.9 % bolus 1,000 mL (not administered)  metoCLOPramide (REGLAN) injection 10 mg (not administered)  diphenhydrAMINE (BENADRYL) injection 25 mg (not administered)  ketorolac (TORADOL) 30 MG/ML injection 30 mg (not administered)     Initial Impression / Assessment and Plan / ED Course  I have reviewed the triage vital signs and the nursing notes.  Pertinent labs & imaging results that were available during my care of the patient were reviewed by me and considered in my medical decision making (see chart for details).  Final Clinical Impressions(s) / ED Diagnoses  {I have reviewed and evaluated the relevant laboratory values. {I have reviewed and evaluated the relevant imaging studies.  {I have reviewed the relevant previous healthcare records.  {I obtained HPI from historian.   ED  Course:  Assessment: Patient is a 31 y.o. female presents to the Emergency Department today due to headache x 10 days. Pt seen in ED x 2 days ago. Labs and imaging unremarkable. Given migraine cocktail with relief. Outpatient Neuro follow up given. Pt returns due to tramadol that was given made her dizzy and that her headache has returned. Pt states headache is unilateral on left side. Describes as throbbing and rates pain 6/10. Intermittent. Notes phonophobia and photophobia. No visual changes. No blurred vision or loss of vision. No CP/SOB/ABD pain. No neck stiffness. No fevers. No gait abnormality. No urinary incontinence. Patient is without high-risk features of headache including: Sudden onset/thunderclap HA, No similar headache in past, Altered mental status, Accompanying seizure, Headache with exertion, Age > 50, History of immunocompromise, Neck or shoulder pain, Fever, Use of anticoagulation, Family history of spontaneous SAH, Concomitant drug use, Toxic exposure.  Patient has a normal complete  neurological exam, normal vital signs, normal level of consciousness, no signs of meningismus, is well-appearing/non-toxic appearing, no signs of trauma. No papilledema, no pain over the temporal arteries. Imaging with CT/MRI not indicated given history and physical exam findings. No dangerous or life-threatening conditions suspected or identified by history, physical exam, and by work-up. No indications for hospitalization identified.   Disposition/Plan:  DC Home Additional Verbal discharge instructions given and discussed with patient.  Pt Instructed to f/u with PCP in the next week for evaluation and treatment of symptoms. Return precautions given Pt acknowledges and agrees with plan  Supervising Physician Tilden Fossa, MD  Final diagnoses:  Acute nonintractable headache, unspecified headache type    ED Discharge Orders    None       Audry Pili, PA-C 11/22/16 1124    Tilden Fossa,  MD 11/23/16 [phone]

## 2016-11-23 ENCOUNTER — Encounter: Payer: Self-pay | Admitting: Neurology

## 2016-11-25 ENCOUNTER — Emergency Department (HOSPITAL_COMMUNITY)
Admission: EM | Admit: 2016-11-25 | Discharge: 2016-11-25 | Disposition: A | Discharge status: Home / Self Care | Payer: 59 | Attending: Emergency Medicine | Admitting: Emergency Medicine

## 2016-11-25 ENCOUNTER — Encounter (HOSPITAL_COMMUNITY): Payer: Self-pay

## 2016-11-25 ENCOUNTER — Other Ambulatory Visit: Payer: Self-pay

## 2016-11-25 DIAGNOSIS — Z79899 Other long term (current) drug therapy: Secondary | ICD-10-CM | POA: Diagnosis not present

## 2016-11-25 DIAGNOSIS — F410 Panic disorder [episodic paroxysmal anxiety] without agoraphobia: Secondary | ICD-10-CM

## 2016-11-25 DIAGNOSIS — F430 Acute stress reaction: Secondary | ICD-10-CM | POA: Insufficient documentation

## 2016-11-25 DIAGNOSIS — R42 Dizziness and giddiness: Secondary | ICD-10-CM

## 2016-11-25 DIAGNOSIS — Z7982 Long term (current) use of aspirin: Secondary | ICD-10-CM | POA: Insufficient documentation

## 2016-11-25 DIAGNOSIS — R002 Palpitations: Secondary | ICD-10-CM | POA: Insufficient documentation

## 2016-11-25 DIAGNOSIS — R51 Headache: Secondary | ICD-10-CM | POA: Insufficient documentation

## 2016-11-25 MED ORDER — HYDROXYZINE HCL 25 MG PO TABS: 25.0000 mg | ORAL_TABLET | Freq: Four times a day (QID) | ORAL | 0 refills | Status: DC | PRN

## 2016-11-25 NOTE — ED Provider Notes (Signed)
[facility] Provider Note   CSN: [id number] Arrival date & time: 11/25/16  1450     History   Chief Complaint Chief Complaint  Patient presents with  . dizziness/seen x 3    HPI Renee Skinner is a 31 y.o. female.  Patient with recent history of headache, ED visits x 2, neg head CT -- presents with complaint of dizziness, malaise, and anxiety attacks.  Patient states that she was seen in the emergency department 3 days ago.  She was given medications which resolved her headaches.  Headache has not returned.  However since that time she has had acute episodes that are triggered by an emotionally stressful event every time.  Episodes consist of rapid heart rate, fast breathing, tinnitus, dizziness, feeling like she is going to die.  Initially the symptoms were tough to improve.  Since that time, patient states that she is able to calm herself by walking and deep breathing before they become bad.  She does not have any chest pain.  She has not had full syncope.  She reports being under a lot of stress with the death of a family member, buying a house, taking care of her 30-year-old child, stress with her job.  She has never had attacks like this in the past. Patient denies signs of stroke including: facial droop, slurred speech, aphasia, weakness/numbness in extremities, imbalance/trouble walking.  She is due to follow-up with her family physician in 5 days.  She has an appointment in December with a neurologist.  Currently patient is asymptomatic.  No other medical complaints noted.       Past Medical History:  Diagnosis Date  . Chlamydia   . Cholestasis of pregnancy     Patient Active Problem List   Diagnosis Date Noted  . Depo-Provera contraceptive status 11/09/2013  . Symptomatic cholelithiasis 12/20/2012  . Carpal tunnel syndrome, bilateral 04/19/2011    Past Surgical History:  Procedure Laterality Date  . CHOLECYSTECTOMY    . NO PAST  SURGERIES      OB History    Gravida Para Term Preterm AB Living   2 1   1 1 1    SAB TAB Ectopic Multiple Live Births     1 0   1       Home Medications    Prior to Admission medications   Medication Sig Start Date End Date Taking? Authorizing Provider  aspirin-acetaminophen-caffeine (EXCEDRIN MIGRAINE) [phone] MG tablet Take 2 tablets every 6 (six) hours as needed by mouth for headache.   Yes [provider]  hydrOXYzine (ATARAX/VISTARIL) 25 MG tablet Take 1 tablet (25 mg total) every 6 (six) hours as needed by mouth for anxiety. 11/25/16   Renne CriglerGeiple, Tareva Leske, PA-C  norgestimate-ethinyl estradiol (ORTHO-CYCLEN,SPRINTEC,PREVIFEM) 0.25-35 MG-MCG tablet Take 1 tablet by mouth daily. Patient not taking: Reported on 11/25/2016 06/18/16   Anyanwu, Jethro BastosUgonna A, MD  ondansetron (ZOFRAN) 4 MG tablet Take 1 tablet (4 mg total) by mouth every 8 (eight) hours as needed for nausea or vomiting. 11/19/16   Maczis, Elmer SowMichael M, PA-C    Family History Family History  Problem Relation Age of Onset  . Cancer Maternal Grandmother        Breast/tn  72 when she passed  . Hypertension Father   . Other Neg Hx     Social History Social History   Tobacco Use  . Smoking status: Never Smoker  . Smokeless tobacco: Never Used  Substance Use Topics  . Alcohol use:  No  . Drug use: No     Allergies   Tramadol and Ginger   Review of Systems Review of Systems  Constitutional: Negative for fever.  HENT: Positive for tinnitus. Negative for rhinorrhea and sore throat.   Eyes: Negative for redness.  Respiratory: Negative for cough.   Cardiovascular: Positive for palpitations. Negative for chest pain.  Gastrointestinal: Positive for nausea. Negative for abdominal pain, diarrhea and vomiting.  Genitourinary: Negative for dysuria.  Musculoskeletal: Negative for myalgias.  Skin: Negative for rash.  Neurological: Positive for dizziness and light-headedness. Negative for headaches (resolved).    Psychiatric/Behavioral: Positive for decreased concentration. The patient is nervous/anxious.      Physical Exam Updated Vital Signs BP 117/64   Pulse 97   Temp 97.9 F (36.6 C) (Oral)   Resp 18   LMP 10/29/2016   SpO2 100%   Physical Exam  Constitutional: She is oriented to person, place, and time. She appears well-developed and well-nourished.  HENT:  Head: Normocephalic and atraumatic.  Right Ear: Tympanic membrane, external ear and ear canal normal.  Left Ear: Tympanic membrane, external ear and ear canal normal.  Nose: Nose normal.  Mouth/Throat: Uvula is midline, oropharynx is clear and moist and mucous membranes are normal.  Eyes: Conjunctivae, EOM and lids are normal. Pupils are equal, round, and reactive to light. Right eye exhibits no nystagmus. Left eye exhibits no nystagmus.  Neck: Normal range of motion. Neck supple.  Cardiovascular: Normal rate and regular rhythm.  Pulmonary/Chest: Effort normal and breath sounds normal. No stridor. No respiratory distress. She has no wheezes.  Abdominal: Soft. There is no tenderness.  Musculoskeletal:       Cervical back: She exhibits normal range of motion, no tenderness and no bony tenderness.  Neurological: She is alert and oriented to person, place, and time. She has normal strength and normal reflexes. No cranial nerve deficit or sensory deficit. She displays a negative Romberg sign. Coordination and gait normal. GCS eye subscore is 4. GCS verbal subscore is 5. GCS motor subscore is 6.  Skin: Skin is warm and dry.  Psychiatric: Her speech is normal and behavior is normal. Thought content normal. Her mood appears anxious. She does not exhibit a depressed mood.  Nursing note and vitals reviewed.    ED Treatments / Results   Procedures Procedures (including critical care time)  Medications Ordered in ED Medications - No data to display   Initial Impression / Assessment and Plan / ED Course  I have reviewed the triage  vital signs and the nursing notes.  Pertinent labs & imaging results that were available during my care of the patient were reviewed by me and considered in my medical decision making (see chart for details).     Patient seen and examined.  Vital signs reviewed and are as follows: BP 127/88   Pulse (!) 102   Temp 97.9 F (36.6 C) (Oral)   Resp 18   LMP 10/29/2016   SpO2 99%   Discussed previous workup.  I had a nice discussion with the patient and her mother at bedside.  Her current symptoms are very suggestive of anxiety with panic attack.  She has a normal neurological exam tonight.  She is going to follow-up with her primary care physician for further evaluation and management.  She may be a candidate for medications or therapy.  Will give hydroxyzine to use in the interim for increased episodes of anxiety.  Encouraged neurology follow-up especially if headaches return.  Final Clinical Impressions(s) / ED Diagnoses   Final diagnoses:  Anxiety attack  Dizziness   Patient recently seen for headaches, now resolved.  She has been having symptoms consistent with anxiety attacks after emotional stressors.  She is under a lot of stress.  No neurological deficits.  Low concern for pseudotumor given her current exam.  Doubt intracranial process which would require MRI such as posterior stroke or sinus venous thrombosis.  Feel that symptomatic treatment with PCP follow-up is appropriate at this point.  ED Discharge Orders        Ordered    hydrOXYzine (ATARAX/VISTARIL) 25 MG tablet  Every 6 hours PRN     11/25/16 1923       Renne CriglerGeiple, Gyan Cambre, Cordelia Poche-C 11/25/16 1932    Mancel BaleWentz, Elliott, MD 11/26/16 [id number]

## 2016-11-25 NOTE — ED Notes (Signed)
To room A4 at 14:59.

## 2016-11-25 NOTE — ED Triage Notes (Signed)
Patient complains of ongoing dizziness and not feeling well for several days, has been seen at [facility] for [facility] and diagnosed with headache. Patient states that she thinks she may have anxiety and that's why she doesn't feel well

## 2016-11-25 NOTE — Discharge Instructions (Signed)
Please read and follow all provided instructions.  Your diagnoses today include:  1. Anxiety attack   2. Dizziness     Tests performed today include:  Vital signs. See below for your results today.   Medications:   Hydroxyzine - antihistamine  You can find this medication over-the-counter.   This medication will make you drowsy. DO NOT drive or perform any activities that require you to be awake and alert if taking this.  Take any prescribed medications only as directed.  Additional information:  Follow any educational materials contained in this packet.  Follow-up instructions: Please follow-up with your primary care provider in the next 3-5 days for further evaluation of your symptoms.   Return instructions:   Please return to the Emergency Department if you experience worsening symptoms.  Return if the medications do not resolve your headache, if it recurs, or if you have multiple episodes of vomiting or cannot keep down fluids.  Return if you have a change from the usual headache.  RETURN IMMEDIATELY IF you:  Develop a sudden, severe headache  Develop confusion or become poorly responsive or faint  Develop a fever above 100.79F or problem breathing  Have a change in speech, vision, swallowing, or understanding  Develop new weakness, numbness, tingling, incoordination in your arms or legs  Have a seizure  Please return if you have any other emergent concerns.  Additional Information:  Your vital signs today were: BP 117/64    Pulse 97    Temp 97.9 F (36.6 C) (Oral)    Resp 18    LMP 10/29/2016    SpO2 100%  If your blood pressure (BP) was elevated above 135/85 this visit, please have this repeated by your doctor within one month. --------------

## 2017-03-09 ENCOUNTER — Ambulatory Visit: Payer: 59 | Admitting: Neurology

## 2017-06-02 ENCOUNTER — Institutional Professional Consult (permissible substitution): Payer: Self-pay | Admitting: Neurology

## 2017-06-20 ENCOUNTER — Encounter: Payer: Self-pay | Admitting: Neurology

## 2017-07-14 ENCOUNTER — Institutional Professional Consult (permissible substitution): Payer: Self-pay | Admitting: Neurology

## 2019-01-14 ENCOUNTER — Telehealth: Payer: 59

## 2019-01-14 ENCOUNTER — Other Ambulatory Visit: Payer: Self-pay

## 2019-01-14 ENCOUNTER — Ambulatory Visit
Admission: EM | Admit: 2019-01-14 | Discharge: 2019-01-14 | Disposition: A | Discharge status: Home / Self Care | Payer: Self-pay | Attending: Emergency Medicine | Admitting: Emergency Medicine

## 2019-01-14 DIAGNOSIS — Z3202 Encounter for pregnancy test, result negative: Secondary | ICD-10-CM

## 2019-01-14 DIAGNOSIS — N3000 Acute cystitis without hematuria: Secondary | ICD-10-CM | POA: Insufficient documentation

## 2019-01-14 DIAGNOSIS — B9689 Other specified bacterial agents as the cause of diseases classified elsewhere: Secondary | ICD-10-CM

## 2019-01-14 LAB — POCT URINE PREGNANCY: Preg Test, Ur: NEGATIVE

## 2019-01-14 LAB — POCT URINALYSIS DIP (MANUAL ENTRY)
Bilirubin, UA: NEGATIVE
Blood, UA: NEGATIVE
Glucose, UA: NEGATIVE mg/dL
Ketones, POC UA: NEGATIVE mg/dL
Nitrite, UA: NEGATIVE
Protein Ur, POC: NEGATIVE mg/dL
Spec Grav, UA: >=1.03 — AB (ref 1.010–1.025)
Urobilinogen, UA: 0.2 E.U./dL
pH, UA: 6.5 (ref 5.0–8.0)

## 2019-01-14 MED ORDER — CEPHALEXIN 500 MG PO CAPS: 500.0000 mg | ORAL_CAPSULE | Freq: Two times a day (BID) | ORAL | 0 refills | Status: AC

## 2019-01-14 NOTE — ED Provider Notes (Signed)
[facility]    CSN: [id number] Arrival date & time: 01/14/19  1447      History   Chief Complaint Chief Complaint  Patient presents with  . Dysuria    HPI Renee Skinner is a 33 y.o. female presenting for burning with urination, cloudy urine, suprapubic cramping x5 days.  Patient denies vaginal discharge, malodor.   Past Medical History:  Diagnosis Date  . Chlamydia   . Cholestasis of pregnancy     Patient Active Problem List   Diagnosis Date Noted  . Depo-Provera contraceptive status 11/09/2013  . Symptomatic cholelithiasis 12/20/2012  . Carpal tunnel syndrome, bilateral 04/19/2011    Past Surgical History:  Procedure Laterality Date  . CHOLECYSTECTOMY    . NO PAST SURGERIES      OB History    Gravida  2   Para  1   Term      Preterm  1   AB  1   Living  1     SAB      TAB  1   Ectopic  0   Multiple      Live Births  1            Home Medications    Prior to Admission medications   Medication Sig Start Date End Date Taking? Authorizing Provider  cephALEXin (KEFLEX) 500 MG capsule Take 1 capsule (500 mg total) by mouth 2 (two) times daily for 5 days. 01/14/19 01/19/19  Hall-Potvin, [place], PA-C  norgestimate-ethinyl estradiol (ORTHO-CYCLEN,SPRINTEC,PREVIFEM) 0.25-35 MG-MCG tablet Take 1 tablet by mouth daily. Patient not taking: Reported on 11/25/2016 06/18/16   Anyanwu, Jethro Bastos, MD  ondansetron (ZOFRAN) 4 MG tablet Take 1 tablet (4 mg total) by mouth every 8 (eight) hours as needed for nausea or vomiting. 11/19/16   Maczis, Elmer Sow, PA-C    Family History Family History  Problem Relation Age of Onset  . Cancer Maternal Grandmother        Breast/tn  72 when she passed  . Hypertension Father   . Other Neg Hx     Social History Social History   Tobacco Use  . Smoking status: Never Smoker  . Smokeless tobacco: Never Used  Substance Use Topics  . Alcohol use: No  . Drug use: No     Allergies   Tramadol and  Ginger   Review of Systems Review of Systems  Constitutional: Negative for fatigue and fever.  Respiratory: Negative for cough and shortness of breath.   Cardiovascular: Negative for chest pain and palpitations.  Gastrointestinal: Negative for constipation and diarrhea.  Genitourinary: Positive for dysuria and frequency. Negative for flank pain, hematuria, pelvic pain, urgency, vaginal bleeding, vaginal discharge and vaginal pain.     Physical Exam Triage Vital Signs ED Triage Vitals  Enc Vitals Group     BP      Pulse      Resp      Temp      Temp src      SpO2      Weight      Height      Head Circumference      Peak Flow      Pain Score      Pain Loc      Pain Edu?      Excl. in GC?    No data found.  Updated Vital Signs BP 118/84 (BP Location: Left Arm)   Pulse (!) 106   Temp 98 F (36.7  C) (Oral)   Resp 16   LMP 12/22/2018   SpO2 96%   Visual Acuity Right Eye Distance:   Left Eye Distance:   Bilateral Distance:    Right Eye Near:   Left Eye Near:    Bilateral Near:     Physical Exam Constitutional:      General: She is not in acute distress. HENT:     Head: Normocephalic and atraumatic.  Eyes:     General: No scleral icterus.    Pupils: Pupils are equal, round, and reactive to light.  Cardiovascular:     Rate and Rhythm: Normal rate.  Pulmonary:     Effort: Pulmonary effort is normal.  Skin:    Coloration: Skin is not jaundiced or pale.  Neurological:     Mental Status: She is alert and oriented to person, place, and time.      UC Treatments / Results  Labs (all labs ordered are listed, but only abnormal results are displayed) Labs Reviewed  URINE CULTURE - Abnormal; Notable for the following components:      Result Value   Culture MULTIPLE SPECIES PRESENT, SUGGEST RECOLLECTION (*)    All other components within normal limits  POCT URINALYSIS DIP (MANUAL ENTRY) - Abnormal; Notable for the following components:   Spec Grav, UA  >=1.030 (*)    Leukocytes, UA Trace (*)    All other components within normal limits  POCT URINE PREGNANCY    EKG   Radiology No results found.  Procedures Procedures (including critical care time)  Medications Ordered in UC Medications - No data to display  Initial Impression / Assessment and Plan / UC Course  I have reviewed the triage vital signs and the nursing notes.  Pertinent labs & imaging results that were available during my care of the patient were reviewed by me and considered in my medical decision making (see chart for details).     Patient afebrile, nontoxic.  Urine pregnancy done in office: Negative.  POC urine dipstick showing trace leukocytes, specific gravity 1.030 - Culture pending.  Will start Keflex today.  Return precautions discussed, patient verbalized understanding and is agreeable to plan. Final Clinical Impressions(s) / UC Diagnoses   Final diagnoses:  Acute cystitis without hematuria     Discharge Instructions     Take antibiotic as prescribed. Return for worsening urinary symptoms, abdominal pain, back pain, fever.    ED Prescriptions    Medication Sig Dispense Auth. Provider   cephALEXin (KEFLEX) 500 MG capsule Take 1 capsule (500 mg total) by mouth 2 (two) times daily for 5 days. 10 capsule Hall-Potvin, [place], PA-C     PDMP not reviewed this encounter.   Hall-Potvin, [place], [place] 01/17/19 2239

## 2019-01-14 NOTE — ED Triage Notes (Signed)
Pt presents to UC w/ burning on urination, cloudy urine, abdominal cramping x5 days ago. Denies increased discharge or odor.

## 2019-01-14 NOTE — Discharge Instructions (Signed)
Take antibiotic as prescribed. Return for worsening urinary symptoms, abdominal pain, back pain, fever.

## 2019-01-16 LAB — URINE CULTURE

## 2019-01-21 ENCOUNTER — Emergency Department (HOSPITAL_BASED_OUTPATIENT_CLINIC_OR_DEPARTMENT_OTHER): Payer: 59

## 2019-01-21 ENCOUNTER — Encounter (HOSPITAL_BASED_OUTPATIENT_CLINIC_OR_DEPARTMENT_OTHER): Payer: Self-pay | Admitting: Emergency Medicine

## 2019-01-21 ENCOUNTER — Emergency Department (HOSPITAL_BASED_OUTPATIENT_CLINIC_OR_DEPARTMENT_OTHER)
Admission: EM | Admit: 2019-01-21 | Discharge: 2019-01-21 | Disposition: A | Discharge status: Home / Self Care | Payer: 59 | Attending: Emergency Medicine | Admitting: Emergency Medicine

## 2019-01-21 ENCOUNTER — Other Ambulatory Visit: Payer: Self-pay

## 2019-01-21 DIAGNOSIS — R05 Cough: Secondary | ICD-10-CM | POA: Diagnosis not present

## 2019-01-21 DIAGNOSIS — Z885 Allergy status to narcotic agent status: Secondary | ICD-10-CM | POA: Diagnosis not present

## 2019-01-21 DIAGNOSIS — M546 Pain in thoracic spine: Secondary | ICD-10-CM | POA: Diagnosis present

## 2019-01-21 DIAGNOSIS — M549 Dorsalgia, unspecified: Secondary | ICD-10-CM

## 2019-01-21 NOTE — ED Provider Notes (Signed)
[facility] EMERGENCY DEPARTMENT Provider Note   CSN: [id number] Arrival date & time: 01/21/19  1356     History Chief Complaint  Patient presents with  . COVID symptoms    Renee Skinner is a 34 y.o. female who presents emergency department with a chief complaint of back pain with coughing.  She is had 3 weeks of cough and sneezing.  She been tested twice for Covid and has been negative each time.  Patient states that last week she was coughing and noticed pain that shot around the left side of her chest wall.  That has resolved but now every time she states that she feels like her upper back and lower backs seize up  HPI     Past Medical History:  Diagnosis Date  . Chlamydia   . Cholestasis of pregnancy     Patient Active Problem List   Diagnosis Date Noted  . Depo-Provera contraceptive status 11/09/2013  . Symptomatic cholelithiasis 12/20/2012  . Carpal tunnel syndrome, bilateral 04/19/2011    Past Surgical History:  Procedure Laterality Date  . CHOLECYSTECTOMY    . NO PAST SURGERIES       OB History    Gravida  2   Para  1   Term      Preterm  1   AB  1   Living  1     SAB      TAB  1   Ectopic  0   Multiple      Live Births  1           Family History  Problem Relation Age of Onset  . Cancer Maternal Grandmother        Breast/tn  72 when she passed  . Hypertension Father   . Other Neg Hx     Social History   Tobacco Use  . Smoking status: Never Smoker  . Smokeless tobacco: Never Used  Substance Use Topics  . Alcohol use: No  . Drug use: No    Home Medications Prior to Admission medications   Medication Sig Start Date End Date Taking? Authorizing Provider  norgestimate-ethinyl estradiol (ORTHO-CYCLEN,SPRINTEC,PREVIFEM) 0.25-35 MG-MCG tablet Take 1 tablet by mouth daily. Patient not taking: Reported on 11/25/2016 06/18/16   Anyanwu, Jethro Bastos, MD  ondansetron (ZOFRAN) 4 MG tablet Take 1 tablet (4 mg total) by mouth  every 8 (eight) hours as needed for nausea or vomiting. 11/19/16   Maczis, Elmer Sow, PA-C    Allergies    Tramadol and Ginger  Review of Systems   Review of Systems Ten systems reviewed and are negative for acute change, except as noted in the HPI.   Physical Exam Updated Vital Signs BP 132/82 (BP Location: Right Arm)   Pulse 98   Temp 98.7 F (37.1 C) (Oral)   Resp 18   Ht 5\' 2"  (1.575 m)   Wt 104.3 kg   LMP 12/22/2018   SpO2 100%   BMI 42.07 kg/m   Physical Exam Physical Exam  Nursing note and vitals reviewed. Constitutional: She is oriented to person, place, and time. She appears well-developed and well-nourished. No distress.  HENT:  Head: Normocephalic and atraumatic.  Eyes: Conjunctivae normal and EOM are normal. Pupils are equal, round, and reactive to light. No scleral icterus.  Neck: Normal range of motion.  Cardiovascular: Normal rate, regular rhythm and normal heart sounds.  Exam reveals no gallop and no friction rub.   No murmur heard. Pulmonary/Chest: Effort normal  and breath sounds normal. No respiratory distress.  Abdominal: Soft. Bowel sounds are normal. She exhibits no distension and no mass. There is no tenderness. There is no guarding.  Neurological: She is alert and oriented to person, place, and time.  Skin: Skin is warm and dry. She is not diaphoretic.    ED Results / Procedures / Treatments   Labs (all labs ordered are listed, but only abnormal results are displayed) Labs Reviewed - No data to display  EKG None  Radiology DG Chest [facility] 1 View  Result Date: 01/21/2019 CLINICAL DATA:  Cough EXAM: PORTABLE CHEST 1 VIEW COMPARISON:  None. FINDINGS: The heart size and mediastinal contours are within normal limits. Both lungs are clear. The visualized skeletal structures are unremarkable. IMPRESSION: No acute abnormality of the lungs in AP portable projection. Electronically Signed   By: Eddie Candle M.D.   On: 01/21/2019 18:30     Procedures Procedures (including critical care time)  Medications Ordered in ED Medications - No data to display  ED Course  I have reviewed the triage vital signs and the nursing notes.  Pertinent labs & imaging results that were available during my care of the patient were reviewed by me and considered in my medical decision making (see chart for details).    MDM Rules/Calculators/A&P                      Personally reviewed the patient's chest x-ray which shows no acute abnormalities on my interpretation.  Patient has had coughing and sneezing.  Think she is having spasm secondary to coughing.  Patient not having any pleuritic chest pain, hemoptysis or other normality suggestive of PE.  She is PERC negative.  Patient appears appropriate for discharge at this time.  Discussed return precautions. Final Clinical Impression(s) / ED Diagnoses Final diagnoses:  Back pain worse on sneezing    Rx / DC Orders ED Discharge Orders    None       Margarita Mail, PA-C 01/22/19 0022    Wyvonnia Dusky, MD 01/22/19 1249

## 2019-01-21 NOTE — Discharge Instructions (Signed)
Your Chest xray was normal. Please see your primary care doctor for pain management. Also you may benefit from physical therapy for back issues.  Please return for the following reasons. Return to the emergency department immediately if you develop any of the following symptoms: You have numbness, tingling, or weakness in the arms or legs. You develop severe headaches not relieved with medicine. You have severe neck pain, especially tenderness in the middle of the back of your neck. You have changes in bowel or bladder control. There is increasing pain in any area of the body. You have shortness of breath, light-headedness, dizziness, or fainting. You have chest pain. You feel sick to your stomach (nauseous), throw up (vomit), or sweat. You have increasing abdominal discomfort. There is blood in your urine, stool, or vomit. You have pain in your shoulder (shoulder strap areas). You feel your symptoms are getting worse.

## 2019-01-21 NOTE — ED Notes (Signed)
Pt denies fever, no loss of taste or smell.  Pts endorses most concern for pain that results when pt sneezes.

## 2019-01-21 NOTE — ED Triage Notes (Signed)
Cough, body aches, sneezing for several days.

## 2020-04-24 ENCOUNTER — Encounter: Payer: Self-pay | Admitting: Family Medicine

## 2020-04-24 ENCOUNTER — Ambulatory Visit (INDEPENDENT_AMBULATORY_CARE_PROVIDER_SITE_OTHER): Payer: 59 | Admitting: Family Medicine

## 2020-04-24 ENCOUNTER — Other Ambulatory Visit: Payer: Self-pay

## 2020-04-24 VITALS — BP 114/79 | HR 100 | Temp 98.1°F | Wt 236.0 lb

## 2020-04-24 DIAGNOSIS — R946 Abnormal results of thyroid function studies: Secondary | ICD-10-CM | POA: Diagnosis not present

## 2020-04-24 DIAGNOSIS — Z0000 Encounter for general adult medical examination without abnormal findings: Secondary | ICD-10-CM | POA: Diagnosis not present

## 2020-04-24 DIAGNOSIS — Z13220 Encounter for screening for lipoid disorders: Secondary | ICD-10-CM | POA: Insufficient documentation

## 2020-04-24 DIAGNOSIS — D500 Iron deficiency anemia secondary to blood loss (chronic): Secondary | ICD-10-CM

## 2020-04-24 LAB — CBC WITH DIFFERENTIAL/PLATELET
Basophils Absolute: 53 cells/uL (ref 0–200)
Eosinophils Absolute: 80 cells/uL (ref 15–500)
Hemoglobin: 12 g/dL (ref 11.7–15.5)
Neutrophils Relative %: 48.4 %
WBC: 8.9 10*3/uL (ref 3.8–10.8)

## 2020-04-24 NOTE — Progress Notes (Signed)
Renee Skinner - 35 y.o. female MRN [id number]  Date of birth: 20-Oct-1985  Subjective Chief Complaint  Patient presents with  . Establish Care    HPI Renee Skinner is a 35 y.o. female here today for initial visit to establish care and annual exam.  She has history of abnormal thyroid function test and iron deficiency anemia.  She would like to have updated labs today.  She denies any other concerns at this time.  She is a non-smoker and denies alcohol use.  She does exercise several days per week however admits that her diet could be improved.  Her last Pap was in 2019 and she does have a GYN.  ROS:  A comprehensive ROS was completed and negative except as noted per HPI  Allergies  Allergen Reactions  . Tramadol Anaphylaxis and Nausea And Vomiting    Dizziness  . Ginger Itching    Past Medical History:  Diagnosis Date  . Chlamydia   . Cholestasis of pregnancy     Past Surgical History:  Procedure Laterality Date  . CHOLECYSTECTOMY    . NO PAST SURGERIES      Social History   Socioeconomic History  . Marital status: Single    Spouse name: Not on file  . Number of children: Not on file  . Years of education: Not on file  . Highest education level: Not on file  Occupational History  . Not on file  Tobacco Use  . Smoking status: Never Smoker  . Smokeless tobacco: Never Used  Vaping Use  . Vaping Use: Never used  Substance and Sexual Activity  . Alcohol use: No  . Drug use: No  . Sexual activity: Yes    Partners: Male    Birth control/protection: None, Condom  Other Topics Concern  . Not on file  Social History Narrative  . Not on file   Social Determinants of Health   Financial Resource Strain: Not on file  Food Insecurity: Not on file  Transportation Needs: Not on file  Physical Activity: Not on file  Stress: Not on file  Social Connections: Not on file    Family History  Problem Relation Age of Onset  . Cancer Maternal Grandmother         Breast/tn  72 when she passed  . Breast cancer Maternal Grandmother   . Hypertension Father   . Stroke Sister   . Breast cancer Paternal Aunt   . Other Neg Hx     Health Maintenance  Topic Date Due  . PAP SMEAR-Modifier  02/11/2018  . COVID-19 Vaccine (3 - Booster for Pfizer series) 05/29/2020  . INFLUENZA VACCINE  08/18/2020  . TETANUS/TDAP  08/09/2021  . Hepatitis C Screening  Completed  . HIV Screening  Completed  . HPV VACCINES  Aged Out     ----------------------------------------------------------------------------------------------------------------------------------------------------------------------------------------------------------------- Physical Exam BP 114/79 (BP Location: Left Arm, Patient Position: Sitting, Cuff Size: Large)   Pulse 100   Temp 98.1 F (36.7 C) (Oral)   Wt 236 lb (107 kg)   SpO2 98%   BMI 43.16 kg/m   Physical Exam Constitutional:      General: She is not in acute distress. HENT:     Head: Normocephalic and atraumatic.     Right Ear: Tympanic membrane normal.     Left Ear: Tympanic membrane normal.     Nose: Nose normal.  Eyes:     General: No scleral icterus.    Conjunctiva/sclera: Conjunctivae normal.  Neck:  Thyroid: No thyromegaly.  Cardiovascular:     Rate and Rhythm: Normal rate and regular rhythm.     Heart sounds: Normal heart sounds.  Pulmonary:     Effort: Pulmonary effort is normal.     Breath sounds: Normal breath sounds.  Abdominal:     General: Bowel sounds are normal. There is no distension.     Palpations: Abdomen is soft.     Tenderness: There is no abdominal tenderness. There is no guarding.  Musculoskeletal:        General: Normal range of motion.     Cervical back: Normal range of motion and neck supple.  Lymphadenopathy:     Cervical: No cervical adenopathy.  Skin:    General: Skin is warm and dry.     Findings: No rash.  Neurological:     Mental Status: She is alert and oriented to person, place,  and time.     Cranial Nerves: No cranial nerve deficit.     Coordination: Coordination normal.  Psychiatric:        Behavior: Behavior normal.     ------------------------------------------------------------------------------------------------------------------------------------------------------------------------------------------------------------------- Assessment and Plan  Well adult exam Well adult Orders Placed This Encounter  Procedures  . COMPLETE METABOLIC PANEL WITH GFR  . CBC with Differential  . Fe+TIBC+Fer  . Lipid Panel w/reflex Direct LDL  . TSH  Immunizations: Up-to-date Screenings: Lipid panel. Anticipatory guidance/risk factor reduction: Recommendations per AVS.   No orders of the defined types were placed in this encounter.   No follow-ups on file.    This visit occurred during the SARS-CoV-2 public health emergency.  Safety protocols were in place, including screening questions prior to the visit, additional usage of staff PPE, and extensive cleaning of exam room while observing appropriate contact time as indicated for disinfecting solutions.

## 2020-04-24 NOTE — Assessment & Plan Note (Signed)
Well adult Orders Placed This Encounter  Procedures  . COMPLETE METABOLIC PANEL WITH GFR  . CBC with Differential  . Fe+TIBC+Fer  . Lipid Panel w/reflex Direct LDL  . TSH  Immunizations: Up-to-date Screenings: Lipid panel. Anticipatory guidance/risk factor reduction: Recommendations per AVS.

## 2020-04-24 NOTE — Patient Instructions (Signed)
Preventive Care 21-34 Years Old, Female Preventive care refers to lifestyle choices and visits with your health care provider that can promote health and wellness. This includes:  A yearly physical exam. This is also called an annual wellness visit.  Regular dental and eye exams.  Immunizations.  Screening for certain conditions.  Healthy lifestyle choices, such as: ? Eating a healthy diet. ? Getting regular exercise. ? Not using drugs or products that contain nicotine and tobacco. ? Limiting alcohol use. What can I expect for my preventive care visit? Physical exam Your health care provider may check your:  Height and weight. These may be used to calculate your BMI (body mass index). BMI is a measurement that tells if you are at a healthy weight.  Heart rate and blood pressure.  Body temperature.  Skin for abnormal spots. Counseling Your health care provider may ask you questions about your:  Past medical problems.  Family's medical history.  Alcohol, tobacco, and drug use.  Emotional well-being.  Home life and relationship well-being.  Sexual activity.  Diet, exercise, and sleep habits.  Work and work environment.  Access to firearms.  Method of birth control.  Menstrual cycle.  Pregnancy history. What immunizations do I need? Vaccines are usually given at various ages, according to a schedule. Your health care provider will recommend vaccines for you based on your age, medical history, and lifestyle or other factors, such as travel or where you work.   What tests do I need? Blood tests  Lipid and cholesterol levels. These may be checked every 5 years starting at age 20.  Hepatitis C test.  Hepatitis B test. Screening  Diabetes screening. This is done by checking your blood sugar (glucose) after you have not eaten for a while (fasting).  STD (sexually transmitted disease) testing, if you are at risk.  BRCA-related cancer screening. This may be  done if you have a family history of breast, ovarian, tubal, or peritoneal cancers.  Pelvic exam and Pap test. This may be done every 3 years starting at age 21. Starting at age 30, this may be done every 5 years if you have a Pap test in combination with an HPV test. Talk with your health care provider about your test results, treatment options, and if necessary, the need for more tests.   Follow these instructions at home: Eating and drinking  Eat a healthy diet that includes fresh fruits and vegetables, whole grains, lean protein, and low-fat dairy products.  Take vitamin and mineral supplements as recommended by your health care provider.  Do not drink alcohol if: ? Your health care provider tells you not to drink. ? You are pregnant, may be pregnant, or are planning to become pregnant.  If you drink alcohol: ? Limit how much you have to 0-1 drink a day. ? Be aware of how much alcohol is in your drink. In the [place] one drink equals one 12 oz bottle of beer (355 mL), one 5 oz glass of wine (148 mL), or one 1 oz glass of hard liquor (44 mL).   Lifestyle  Take daily care of your teeth and gums. Brush your teeth every morning and night with fluoride toothpaste. Floss one time each day.  Stay active. Exercise for at least 30 minutes 5 or more days each week.  Do not use any products that contain nicotine or tobacco, such as cigarettes, e-cigarettes, and chewing tobacco. If you need help quitting, ask your health care provider.  Do not   use drugs.  If you are sexually active, practice safe sex. Use a condom or other form of protection to prevent STIs (sexually transmitted infections).  If you do not wish to become pregnant, use a form of birth control. If you plan to become pregnant, see your health care provider for a prepregnancy visit.  Find healthy ways to cope with stress, such as: ? Meditation, yoga, or listening to music. ? Journaling. ? Talking to a trusted  person. ? Spending time with friends and family. Safety  Always wear your seat belt while driving or riding in a vehicle.  Do not drive: ? If you have been drinking alcohol. Do not ride with someone who has been drinking. ? When you are tired or distracted. ? While texting.  Wear a helmet and other protective equipment during sports activities.  If you have firearms in your house, make sure you follow all gun safety procedures.  Seek help if you have been physically or sexually abused. What's next?  Go to your health care provider once a year for an annual wellness visit.  Ask your health care provider how often you should have your eyes and teeth checked.  Stay up to date on all vaccines. This information is not intended to replace advice given to you by your health care provider. Make sure you discuss any questions you have with your health care provider. Document Revised: 09/02/2019 Document Reviewed: 09/15/2017 Elsevier Patient Education  2021 [employer].

## 2020-04-25 LAB — LIPID PANEL W/REFLEX DIRECT LDL: Total CHOL/HDL Ratio: 2.3 (calc) (ref <5.0)

## 2020-04-25 LAB — COMPLETE METABOLIC PANEL WITH GFR
AG Ratio: 1.1 (calc) (ref 1.0–2.5)
Globulin: 3.6 g/dL (calc) (ref 1.9–3.7)
Glucose, Bld: 84 mg/dL (ref 65–99)

## 2020-04-25 LAB — CBC WITH DIFFERENTIAL/PLATELET: RBC: 4.62 10*6/uL (ref 3.80–5.10)

## 2020-04-26 LAB — IRON,TIBC AND FERRITIN PANEL
%SAT: 17 % (calc) (ref 16–45)
Ferritin: 26 ng/mL (ref 16–154)
Iron: 52 ug/dL (ref 40–190)
TIBC: 307 mcg/dL (calc) (ref 250–450)

## 2020-04-26 LAB — CBC WITH DIFFERENTIAL/PLATELET
Absolute Monocytes: 650 cells/uL (ref 200–950)
Basophils Relative: 0.6 %
Eosinophils Relative: 0.9 %
HCT: 38.5 % (ref 35.0–45.0)
Lymphs Abs: 3809 cells/uL (ref 850–3900)
MCH: 26 pg — ABNORMAL LOW (ref 27.0–33.0)
MCHC: 31.2 g/dL — ABNORMAL LOW (ref 32.0–36.0)
MCV: 83.3 fL (ref 80.0–100.0)
MPV: 10.7 fL (ref 7.5–12.5)
Monocytes Relative: 7.3 %
Neutro Abs: 4308 cells/uL (ref 1500–7800)
Platelets: 382 10*3/uL (ref 140–400)
RDW: 13.5 % (ref 11.0–15.0)
Total Lymphocyte: 42.8 %

## 2020-04-26 LAB — COMPLETE METABOLIC PANEL WITH GFR
ALT: 17 U/L (ref 6–29)
AST: 16 U/L (ref 10–30)
Albumin: 3.9 g/dL (ref 3.6–5.1)
Alkaline phosphatase (APISO): 85 U/L (ref 31–125)
BUN: 11 mg/dL (ref 7–25)
CO2: 27 mmol/L (ref 20–32)
Calcium: 9.6 mg/dL (ref 8.6–10.2)
Chloride: 104 mmol/L (ref 98–110)
Creat: 0.88 mg/dL (ref 0.50–1.10)
GFR, Est African American: 100 mL/min/{1.73_m2} (ref >=60)
GFR, Est Non African American: 86 mL/min/{1.73_m2} (ref >=60)
Potassium: 4.5 mmol/L (ref 3.5–5.3)
Sodium: 139 mmol/L (ref 135–146)
Total Bilirubin: 0.3 mg/dL (ref 0.2–1.2)
Total Protein: 7.5 g/dL (ref 6.1–8.1)

## 2020-04-26 LAB — TSH: TSH: 0.39 mIU/L — ABNORMAL LOW

## 2020-04-26 LAB — LIPID PANEL W/REFLEX DIRECT LDL
Cholesterol: 121 mg/dL (ref <200)
HDL: 53 mg/dL (ref >=50)
LDL Cholesterol (Calc): 42 mg/dL (calc)
Non-HDL Cholesterol (Calc): 68 mg/dL (calc) (ref <130)
Triglycerides: 183 mg/dL — ABNORMAL HIGH (ref <150)

## 2020-04-26 LAB — T4, FREE: Free T4: 1.1 ng/dL (ref 0.8–1.8)

## 2020-09-25 IMAGING — DX DG CHEST 1V PORT
1 series · 1 of 1 positions shown · non-contrast
Comparison: None.

CLINICAL DATA: Cough

EXAM:
PORTABLE CHEST 1 VIEW

[chest ap]
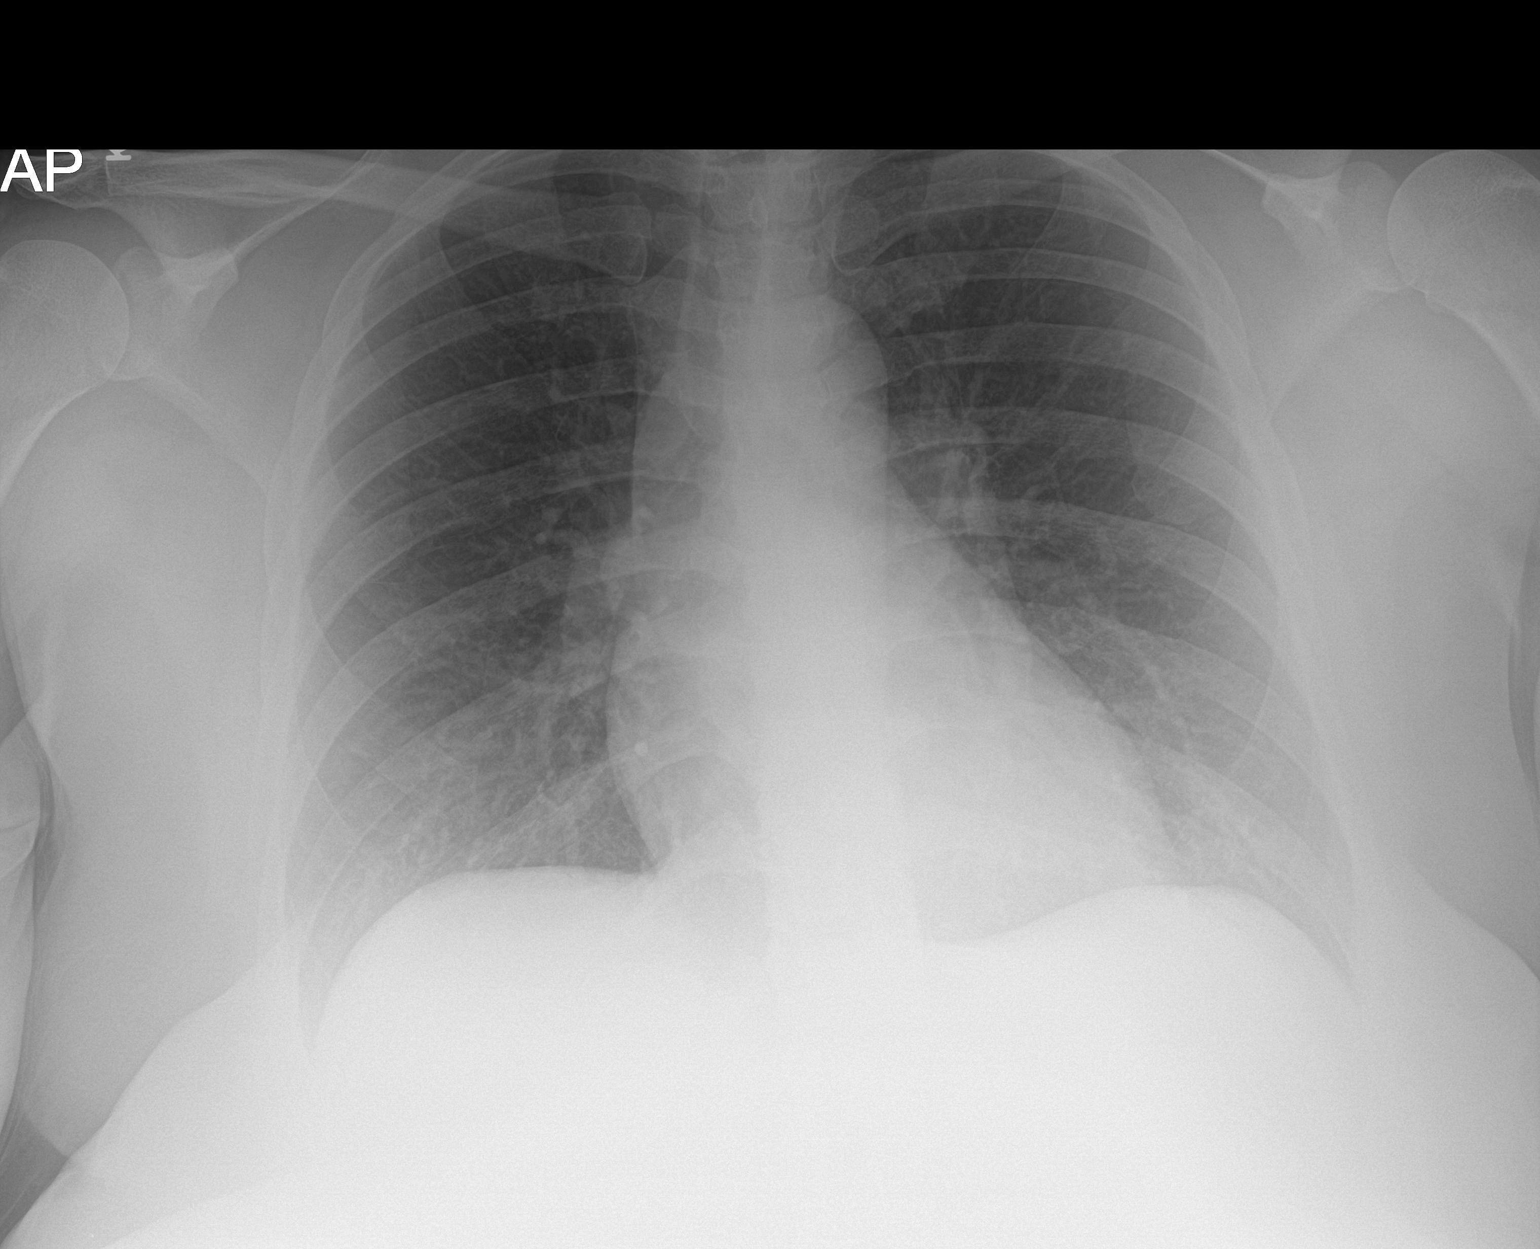

[1 of 1 positions shown; findings below may reference images not displayed]

FINDINGS: The heart size and mediastinal contours are within normal limits.
Both lungs are clear. The visualized skeletal structures are
unremarkable.
IMPRESSION: No acute abnormality of the lungs in AP portable projection.

## 2021-02-17 ENCOUNTER — Emergency Department (INDEPENDENT_AMBULATORY_CARE_PROVIDER_SITE_OTHER)
Admission: RE | Admit: 2021-02-17 | Discharge: 2021-02-17 | Disposition: A | Discharge status: Home / Self Care | Payer: No Typology Code available for payment source | Source: Ambulatory Visit | Attending: Family Medicine | Admitting: Family Medicine

## 2021-02-17 ENCOUNTER — Other Ambulatory Visit (HOSPITAL_COMMUNITY)
Admission: RE | Admit: 2021-02-17 | Discharge: 2021-02-17 | Disposition: A | Discharge status: Home / Self Care | Payer: No Typology Code available for payment source | Source: Ambulatory Visit | Attending: Family Medicine | Admitting: Family Medicine

## 2021-02-17 ENCOUNTER — Other Ambulatory Visit: Payer: Self-pay

## 2021-02-17 VITALS — BP 119/86 | HR 96 | Temp 98.6°F | Resp 15 | Ht 62.0 in | Wt 236.0 lb

## 2021-02-17 DIAGNOSIS — N760 Acute vaginitis: Secondary | ICD-10-CM

## 2021-02-17 DIAGNOSIS — Z113 Encounter for screening for infections with a predominantly sexual mode of transmission: Secondary | ICD-10-CM | POA: Diagnosis present

## 2021-02-17 DIAGNOSIS — Z7689 Persons encountering health services in other specified circumstances: Secondary | ICD-10-CM

## 2021-02-17 DIAGNOSIS — N949 Unspecified condition associated with female genital organs and menstrual cycle: Secondary | ICD-10-CM | POA: Diagnosis not present

## 2021-02-17 MED ORDER — VALACYCLOVIR HCL 1 G PO TABS: 1000.0000 mg | ORAL_TABLET | Freq: Three times a day (TID) | ORAL | 0 refills | Status: DC

## 2021-02-17 NOTE — Discharge Instructions (Addendum)
Check MyChart for test results You will be called if any of your tests are positive I am treating you with an antiviral medication for likely herpes infection No sexual relations for 7 days Follow-up with your primary care doctor

## 2021-02-17 NOTE — ED Triage Notes (Signed)
Pt has vaginal discharge - white - no odor  Pt does wax - has had ingrown hairs in the past  Small red sore on left labia x 4 days  Treated w/ Vaseline Would also like STD testing  Was very painful & is slightly better today

## 2021-02-17 NOTE — ED Provider Notes (Signed)
[name]    CSN: [id number] Arrival date & time: 02/17/21  1054      History   Chief Complaint Chief Complaint  Patient presents with   Vaginal Discharge    HPI Renee Skinner is a 36 y.o. female.   HPI  Patient has a vaginal discharge.  No itching or discomfort.  No odor. Patient also has a painful lump on her left labia she would like to have checked.  No history of similar lesions.  No history of herpes She would also like blood work to check for STD.  HIV and RPR are recommended. Patient states she is just getting out of a relationship and wants to be tested for "everything" given the new genital lesion.  Past Medical History:  Diagnosis Date   Chlamydia    Cholestasis of pregnancy     Patient Active Problem List   Diagnosis Date Noted   Well adult exam 04/24/2020   Symptomatic cholelithiasis 12/20/2012   Carpal tunnel syndrome, bilateral 04/19/2011    Past Surgical History:  Procedure Laterality Date   CHOLECYSTECTOMY      OB History     Gravida  2   Para  1   Term      Preterm  1   AB  1   Living  1      SAB      IAB  1   Ectopic  0   Multiple      Live Births  1            Home Medications    Prior to Admission medications   Medication Sig Start Date End Date Taking? Authorizing Provider  valACYclovir (VALTREX) 1000 MG tablet Take 1 tablet (1,000 mg total) by mouth 3 (three) times daily. 02/17/21  Yes Eustace Moore, MD    Family History Family History  Problem Relation Age of Onset   Healthy Mother    Hypertension Father    Stroke Sister    Cancer Maternal Grandmother        Breast/tn  27 when she passed   Breast cancer Maternal Grandmother    Breast cancer Paternal Aunt    Other Neg Hx     Social History Social History   Tobacco Use   Smoking status: Never    Passive exposure: Never   Smokeless tobacco: Never  Vaping Use   Vaping Use: Never used  Substance Use Topics   Alcohol use: No    Drug use: No     Allergies   Tramadol and Ginger   Review of Systems Review of Systems See HPI  Physical Exam Triage Vital Signs ED Triage Vitals  Enc Vitals Group     BP 02/17/21 1139 119/86     Pulse Rate 02/17/21 1139 96     Resp 02/17/21 1139 15     Temp 02/17/21 1139 98.6 F (37 C)     Temp Source 02/17/21 1139 Oral     SpO2 02/17/21 1139 100 %     Weight 02/17/21 1142 236 lb (107 kg)     Height 02/17/21 1142 5\' 2"  (1.575 m)     Head Circumference --      Peak Flow --      Pain Score 02/17/21 1141 4     Pain Loc --      Pain Edu? --      Excl. in GC? --    No data found.  Updated Vital Signs  BP 119/86 (BP Location: Right Arm)    Pulse 96    Temp 98.6 F (37 C) (Oral)    Resp 15    Ht 5\' 2"  (1.575 m)    Wt 107 kg    LMP 01/24/2021 (Exact Date)    SpO2 100%    BMI 43.16 kg/m       Physical Exam Constitutional:      General: She is not in acute distress.    Appearance: She is well-developed.  HENT:     Head: Normocephalic and atraumatic.     Mouth/Throat:     Comments: Mask is in place Eyes:     Conjunctiva/sclera: Conjunctivae normal.     Pupils: Pupils are equal, round, and reactive to light.  Cardiovascular:     Rate and Rhythm: Normal rate.  Pulmonary:     Effort: Pulmonary effort is normal. No respiratory distress.  Abdominal:     General: There is no distension.     Palpations: Abdomen is soft.  Genitourinary:    Comments: External genitalia is normal with the exception of a cluster of blisters on an erythematous raised to base present in the mid left labium majora.  Scant white vaginal secretions Musculoskeletal:        General: Normal range of motion.     Cervical back: Normal range of motion.  Skin:    General: Skin is warm and dry.     Findings: Lesion present.  Neurological:     General: No focal deficit present.     Mental Status: She is alert.  Psychiatric:        Mood and Affect: Mood normal.        Behavior: Behavior normal.      UC Treatments / Results  Labs (all labs ordered are listed, but only abnormal results are displayed) Labs Reviewed  HSV CULTURE AND TYPING  RPR  HIV ANTIBODY (ROUTINE TESTING W REFLEX)  CERVICOVAGINAL ANCILLARY ONLY    EKG   Radiology No results found.  Procedures Procedures (including critical care time)  Medications Ordered in UC Medications - No data to display  Initial Impression / Assessment and Plan / UC Course  I have reviewed the triage vital signs and the nursing notes.  Pertinent labs & imaging results that were available during my care of the patient were reviewed by me and considered in my medical decision making (see chart for details).     Testing is pending.  I reviewed with the patient byExamination findings and suspicion that this is a herpes lesion on her labia.  This is by visual inspection only.  Cultures pending. Final Clinical Impressions(s) / UC Diagnoses   Final diagnoses:  Acute vaginitis  Female genital lesion  Encounter for assessment of STD exposure     Discharge Instructions      Check MyChart for test results You will be called if any of your tests are positive I am treating you with an antiviral medication for likely herpes infection No sexual relations for 7 days Follow-up with your primary care doctor    ED Prescriptions     Medication Sig Dispense Auth. Provider   valACYclovir (VALTREX) 1000 MG tablet Take 1 tablet (1,000 mg total) by mouth 3 (three) times daily. 21 tablet 03/24/2021, MD      PDMP not reviewed this encounter.   Eustace Moore, MD 02/17/21 1228

## 2021-02-18 ENCOUNTER — Telehealth: Payer: Self-pay

## 2021-02-18 LAB — TIQ- AMBIGUOUS ORDER

## 2021-02-18 LAB — CERVICOVAGINAL ANCILLARY ONLY
Bacterial Vaginitis (gardnerella): POSITIVE — AB
Candida Glabrata: NEGATIVE
Candida Vaginitis: NEGATIVE
Comment: NEGATIVE
Comment: NEGATIVE
Comment: NEGATIVE
Comment: NEGATIVE
Trichomonas: NEGATIVE

## 2021-02-18 LAB — HIV ANTIBODY (ROUTINE TESTING W REFLEX): HIV 1&2 Ab, 4th Generation: NONREACTIVE

## 2021-02-18 LAB — RPR: RPR Ser Ql: NONREACTIVE

## 2021-02-18 NOTE — Telephone Encounter (Signed)
TC from pt needing verification on test in question. Pt notified that lab possibly needed additional information regarding swab performed and this RN would reach out to the lab for verification. Lab contacted and source needed conformation. Lab notified swab was vaginal. Pt notified additional information was provided to the lab.

## 2021-02-19 LAB — HERPES SIMPLEX VIRUS CULTURE W/RFLX TO TYPING
MICRO NUMBER:: 12943844
SPECIMEN QUALITY:: ADEQUATE

## 2021-02-20 ENCOUNTER — Telehealth: Payer: Self-pay | Admitting: Emergency Medicine

## 2021-02-20 ENCOUNTER — Telehealth (HOSPITAL_COMMUNITY): Payer: Self-pay | Admitting: Emergency Medicine

## 2021-02-20 ENCOUNTER — Telehealth: Payer: Self-pay

## 2021-02-20 MED ORDER — METRONIDAZOLE 500 MG PO TABS: 500.0000 mg | ORAL_TABLET | Freq: Two times a day (BID) | ORAL | 0 refills | Status: DC

## 2021-02-20 NOTE — Telephone Encounter (Signed)
Pt called regarding lab results.  Pt was looking at results through her my chart for the hsv testing result. As of 02/20/21 hsv culture hasn't been resulted and patient was made aware.  Pt was told that if anything comes back abnormal that she will be notified.

## 2021-02-20 NOTE — Telephone Encounter (Signed)
Call to [facility] to check on test in process. Pt has called regarding HSV culture results. Listed as in process. RN confirmed w/ [facility] rep that HSV culture was negative. Call to [facility] to confirm results. RN also asked pt if she had picked up the other medication that Katie had sent in per protocol. Pt will pick up the medication and start tonight or tomorrow. Pt states she can not take both medications- okay to stop valtrex - pain & lesion have resolved per pt. Pt asked if she should be retested for HIV - see current results. Pt stated she had a GYN appointment scheduled . RN encouraged pt to discuss her concerns regarding the cause of the lesion. Pt verbalized an understanding.

## 2021-09-18 ENCOUNTER — Ambulatory Visit: Payer: No Typology Code available for payment source | Admitting: Family Medicine

## 2021-09-23 ENCOUNTER — Ambulatory Visit (INDEPENDENT_AMBULATORY_CARE_PROVIDER_SITE_OTHER): Payer: No Typology Code available for payment source | Admitting: Family Medicine

## 2021-09-23 ENCOUNTER — Encounter: Payer: Self-pay | Admitting: Family Medicine

## 2021-09-23 VITALS — BP 121/84 | HR 99 | Ht 62.0 in | Wt 245.0 lb

## 2021-09-23 DIAGNOSIS — Z0000 Encounter for general adult medical examination without abnormal findings: Secondary | ICD-10-CM | POA: Diagnosis not present

## 2021-09-23 DIAGNOSIS — Z13220 Encounter for screening for lipoid disorders: Secondary | ICD-10-CM

## 2021-09-23 NOTE — Progress Notes (Signed)
Renee Skinner - 36 y.o. female MRN [id number]  Date of birth: [birth year]  Subjective Chief Complaint  Patient presents with   thyroid f/u    HPI Renee Skinner is a 36 year old female here today for annual exam.  Overall she reports she is doing well.  She does have concerns about underactive thyroid.  She has noted some hair loss and fatigue.  She has tried to stay pretty active.  Feels like diet is pretty good.  She is a non-smoker.  Rare alcohol use.  Immunizations: Plans to get flu vaccine through her employer.  Review of Systems  Constitutional:  Negative for chills, fever, malaise/fatigue and weight loss.  HENT:  Negative for congestion, ear pain and sore throat.   Eyes:  Negative for blurred vision, double vision and pain.  Respiratory:  Negative for cough and shortness of breath.   Cardiovascular:  Negative for chest pain and palpitations.  Gastrointestinal:  Negative for abdominal pain, blood in stool, constipation, heartburn and nausea.  Genitourinary:  Negative for dysuria and urgency.  Musculoskeletal:  Negative for joint pain and myalgias.  Neurological:  Negative for dizziness and headaches.  Endo/Heme/Allergies:  Does not bruise/bleed easily.  Psychiatric/Behavioral:  Negative for depression. The patient is not nervous/anxious and does not have insomnia.     Allergies  Allergen Reactions   Tramadol Anaphylaxis and Nausea And Vomiting    Dizziness   Ginger Itching    Past Medical History:  Diagnosis Date   Chlamydia    Cholestasis of pregnancy     Past Surgical History:  Procedure Laterality Date   CHOLECYSTECTOMY      Social History   Socioeconomic History   Marital status: Single    Spouse name: Not on file   Number of children: Not on file   Years of education: Not on file   Highest education level: Not on file  Occupational History   Not on file  Tobacco Use   Smoking status: Never    Passive exposure: Never   Smokeless tobacco: Never   Vaping Use   Vaping Use: Never used  Substance and Sexual Activity   Alcohol use: No   Drug use: No   Sexual activity: Yes    Partners: Male    Birth control/protection: None, Condom  Other Topics Concern   Not on file  Social History Narrative   Not on file   Social Determinants of Health   Financial Resource Strain: Not on file  Food Insecurity: Not on file  Transportation Needs: Not on file  Physical Activity: Not on file  Stress: Not on file  Social Connections: Not on file    Family History  Problem Relation Age of Onset   Healthy Mother    Hypertension Father    Stroke Sister    Cancer Maternal Grandmother        Breast/tn  72 when she passed   Breast cancer Maternal Grandmother    Breast cancer Paternal Aunt    Other Neg Hx     Health Maintenance  Topic Date Due   INFLUENZA VACCINE  04/18/2022 (Originally 08/18/2021)   PAP SMEAR-Modifier  09/24/2022 (Originally 02/11/2018)   TETANUS/TDAP  09/24/2022 (Originally 08/09/2021)   COVID-19 Vaccine (3 - Pfizer series) 10/10/2022 (Originally 01/25/2020)   Hepatitis C Screening  Completed   HIV Screening  Completed   HPV VACCINES  Aged Out     ----------------------------------------------------------------------------------------------------------------------------------------------------------------------------------------------------------------- Physical Exam BP 121/84 (BP Location: Left Arm, Patient Position: Sitting, Cuff Size: Normal)  Pulse 99   Ht 5\' 2"  (1.575 m)   Wt 245 lb (111.1 kg)   SpO2 100%   BMI 44.81 kg/m   Physical Exam Constitutional:      General: She is not in acute distress. HENT:     Head: Normocephalic and atraumatic.     Right Ear: Tympanic membrane and ear canal normal.     Left Ear: Tympanic membrane and ear canal normal.     Nose: Nose normal.  Eyes:     General: No scleral icterus.    Conjunctiva/sclera: Conjunctivae normal.  Neck:     Thyroid: No thyromegaly.   Cardiovascular:     Rate and Rhythm: Normal rate and regular rhythm.     Heart sounds: Normal heart sounds.  Pulmonary:     Effort: Pulmonary effort is normal.     Breath sounds: Normal breath sounds.  Abdominal:     General: Bowel sounds are normal. There is no distension.     Palpations: Abdomen is soft.     Tenderness: There is no abdominal tenderness. There is no guarding.  Musculoskeletal:        General: Normal range of motion.     Cervical back: Normal range of motion and neck supple.  Lymphadenopathy:     Cervical: No cervical adenopathy.  Skin:    General: Skin is warm and dry.     Findings: No rash.  Neurological:     General: No focal deficit present.     Mental Status: She is alert and oriented to person, place, and time.     Cranial Nerves: No cranial nerve deficit.     Coordination: Coordination normal.  Psychiatric:        Mood and Affect: Mood normal.        Behavior: Behavior normal.     ------------------------------------------------------------------------------------------------------------------------------------------------------------------------------------------------------------------- Assessment and Plan  Well adult exam Well adult Orders Placed This Encounter  Procedures   COMPLETE METABOLIC PANEL WITH GFR   CBC with Differential   Lipid Panel w/reflex Direct LDL   TSH   T4, free  Screenings: Per lab orders.  She does have Paps completed through GYN. Immunizations: Declines flu and Tdap today. Anticipatory guidance/risk factor reduction: Recommendations per AVS.   No orders of the defined types were placed in this encounter.   No follow-ups on file.    This visit occurred during the SARS-CoV-2 public health emergency.  Safety protocols were in place, including screening questions prior to the visit, additional usage of staff PPE, and extensive cleaning of exam room while observing appropriate contact time as indicated for  disinfecting solutions.

## 2021-09-23 NOTE — Patient Instructions (Signed)

## 2021-09-23 NOTE — Assessment & Plan Note (Signed)
Well adult Orders Placed This Encounter  Procedures  . COMPLETE METABOLIC PANEL WITH GFR  . CBC with Differential  . Lipid Panel w/reflex Direct LDL  . TSH  . T4, free  Screenings: Per lab orders.  She does have Paps completed through GYN. Immunizations: Declines flu and Tdap today. Anticipatory guidance/risk factor reduction: Recommendations per AVS.

## 2021-09-24 LAB — COMPLETE METABOLIC PANEL WITH GFR
AG Ratio: 1.1 (calc) (ref 1.0–2.5)
ALT: 22 U/L (ref 6–29)
AST: 15 U/L (ref 10–30)
Albumin: 4.2 g/dL (ref 3.6–5.1)
Alkaline phosphatase (APISO): 98 U/L (ref 31–125)
BUN: 12 mg/dL (ref 7–25)
CO2: 23 mmol/L (ref 20–32)
Calcium: 9.9 mg/dL (ref 8.6–10.2)
Chloride: 103 mmol/L (ref 98–110)
Creat: 0.87 mg/dL (ref 0.50–0.97)
Globulin: 3.8 g/dL (calc) — ABNORMAL HIGH (ref 1.9–3.7)
Glucose, Bld: 90 mg/dL (ref 65–139)
Potassium: 4.1 mmol/L (ref 3.5–5.3)
Sodium: 137 mmol/L (ref 135–146)
Total Bilirubin: 0.3 mg/dL (ref 0.2–1.2)
Total Protein: 8 g/dL (ref 6.1–8.1)
eGFR: 89 mL/min/{1.73_m2} (ref >=60)

## 2021-09-24 LAB — CBC WITH DIFFERENTIAL/PLATELET
Absolute Monocytes: 602 cells/uL (ref 200–950)
Basophils Absolute: 38 cells/uL (ref 0–200)
Basophils Relative: 0.4 %
Eosinophils Absolute: 66 cells/uL (ref 15–500)
Eosinophils Relative: 0.7 %
HCT: 40.6 % (ref 35.0–45.0)
Hemoglobin: 13.2 g/dL (ref 11.7–15.5)
Lymphs Abs: 4333 cells/uL — ABNORMAL HIGH (ref 850–3900)
MCH: 27.6 pg (ref 27.0–33.0)
MCHC: 32.5 g/dL (ref 32.0–36.0)
MCV: 84.8 fL (ref 80.0–100.0)
MPV: 10.6 fL (ref 7.5–12.5)
Monocytes Relative: 6.4 %
Neutro Abs: 4362 cells/uL (ref 1500–7800)
Neutrophils Relative %: 46.4 %
Platelets: 363 10*3/uL (ref 140–400)
RBC: 4.79 10*6/uL (ref 3.80–5.10)
RDW: 13.5 % (ref 11.0–15.0)
Total Lymphocyte: 46.1 %
WBC: 9.4 10*3/uL (ref 3.8–10.8)

## 2021-09-24 LAB — T4, FREE: Free T4: 0.9 ng/dL (ref 0.8–1.8)

## 2021-09-24 LAB — LIPID PANEL W/REFLEX DIRECT LDL
Cholesterol: 127 mg/dL (ref <200)
HDL: 55 mg/dL (ref >=50)
LDL Cholesterol (Calc): 47 mg/dL (calc)
Non-HDL Cholesterol (Calc): 72 mg/dL (calc) (ref <130)
Total CHOL/HDL Ratio: 2.3 (calc) (ref <5.0)
Triglycerides: 170 mg/dL — ABNORMAL HIGH (ref <150)

## 2021-09-24 LAB — TSH: TSH: 0.75 mIU/L

## 2021-09-25 ENCOUNTER — Other Ambulatory Visit: Payer: Self-pay

## 2021-09-29 ENCOUNTER — Telehealth: Payer: Self-pay

## 2021-10-01 NOTE — Telephone Encounter (Signed)
TSH looks ok.  We'll continue ot keep an eye on this at her routine appointments.

## 2021-10-01 NOTE — Telephone Encounter (Signed)
Pt was concerned about TSH lowering consistently. Please advise.

## 2022-02-10 ENCOUNTER — Telehealth: Payer: No Typology Code available for payment source | Admitting: Nurse Practitioner

## 2022-02-10 DIAGNOSIS — A6004 Herpesviral vulvovaginitis: Secondary | ICD-10-CM | POA: Diagnosis not present

## 2022-02-10 MED ORDER — VALACYCLOVIR HCL 1 G PO TABS: 1000.0000 mg | ORAL_TABLET | Freq: Three times a day (TID) | ORAL | 0 refills | Status: DC

## 2022-02-10 NOTE — Progress Notes (Signed)
Virtual Visit Consent   Renee Skinner, you are scheduled for a virtual visit with Mary-Margaret Hassell Done, [place], a [facility] provider, today.     Just as with appointments in the office, your consent must be obtained to participate.  Your consent will be active for this visit and any virtual visit you may have with one of our providers in the next 365 days.     If you have a MyChart account, a copy of this consent can be sent to you electronically.  All virtual visits are billed to your insurance company just like a traditional visit in the office.    As this is a virtual visit, video technology does not allow for your provider to perform a traditional examination.  This may limit your provider's ability to fully assess your condition.  If your provider identifies any concerns that need to be evaluated in person or the need to arrange testing (such as labs, EKG, etc.), we will make arrangements to do so.     Although advances in technology are sophisticated, we cannot ensure that it will always work on either your end or our end.  If the connection with a video visit is poor, the visit may have to be switched to a telephone visit.  With either a video or telephone visit, we are not always able to ensure that we have a secure connection.     I need to obtain your verbal consent now.   Are you willing to proceed with your visit today? YES   Samuel Peterkin has provided verbal consent on 02/10/2022 for a virtual visit (video or telephone).   Mary-Margaret Hassell Done, FNP   Date: 02/10/2022 10:37 AM   Virtual Visit via Video Note   I, Mary-Margaret Hassell Done, connected with Renee Skinner ([id number], 02-08-1985) on 02/10/22 at 11:00 AM EST by a video-enabled telemedicine application and verified that I am speaking with the correct person using two identifiers.  Location: Patient: Virtual Visit Location Patient: Home Provider: Virtual Visit Location Provider: Mobile   I discussed the limitations of  evaluation and management by telemedicine and the availability of in person appointments. The patient expressed understanding and agreed to proceed.    History of Present Illness: Renee Skinner is a 37 y.o. who identifies as a female who was assigned female at birth, and is being seen today for herpes breakout.  HPI: Patient has herpes and gets flare up 2-3 x a year. She started breaking out yesterday. Needs refill of acyclovir.    ROS  Problems:  Patient Active Problem List   Diagnosis Date Noted   Well adult exam 04/24/2020   Symptomatic cholelithiasis 12/20/2012   Carpal tunnel syndrome, bilateral 04/19/2011    Allergies:  Allergies  Allergen Reactions   Tramadol Anaphylaxis and Nausea And Vomiting    Dizziness   Ginger Itching   Medications:  Current Outpatient Medications:    valACYclovir (VALTREX) 1000 MG tablet, Take 1 tablet (1,000 mg total) by mouth 3 (three) times daily., Disp: 21 tablet, Rfl: 0  Observations/Objective: Patient is well-developed, well-nourished in no acute distress.  Resting comfortably  at home.  Head is normocephalic, atraumatic.  No labored breathing.  Speech is clear and coherent with logical content.  Patient is alert and oriented at baseline.  Patient states outbreak on labia of vesicular lesions.  Assessment and Plan:  Renee Skinner in today with chief complaint of hepres breakout   1. Herpes simplex vulvovaginitis Avoid hot baths Safe sex  Meds  ordered this encounter  Medications   valACYclovir (VALTREX) 1000 MG tablet    Sig: Take 1 tablet (1,000 mg total) by mouth 3 (three) times daily.    Dispense:  21 tablet    Refill:  0    Order Specific Question:   Supervising Provider    Answer:   Chase Picket [id number]      Follow Up Instructions: I discussed the assessment and treatment plan with the patient. The patient was provided an opportunity to ask questions and all were answered. The patient agreed with the plan and  demonstrated an understanding of the instructions.  A copy of instructions were sent to the patient via MyChart.  The patient was advised to call back or seek an in-person evaluation if the symptoms worsen or if the condition fails to improve as anticipated.  Time:  I spent 12 minutes with the patient via telehealth technology discussing the above problems/concerns.    Mary-Margaret Hassell Done, FNP

## 2022-02-10 NOTE — Patient Instructions (Signed)
  Jillyn Hidden, thank you for joining Chevis Pretty, FNP for today's virtual visit.  While this provider is not your primary care provider (PCP), if your PCP is located in our provider database this encounter information will be shared with them immediately following your visit.   [facility] account gives you access to today's visit and all your visits, tests, and labs performed at [facility] " click here if you don't have a [facility] account or go to [email or web address]  Consent: (Patient) Jillyn Hidden provided verbal consent for this virtual visit at the beginning of the encounter.  Current Medications:  Current Outpatient Medications:    valACYclovir (VALTREX) 1000 MG tablet, Take 1 tablet (1,000 mg total) by mouth 3 (three) times daily., Disp: 21 tablet, Rfl: 0   Medications ordered in this encounter:  Meds ordered this encounter  Medications   valACYclovir (VALTREX) 1000 MG tablet    Sig: Take 1 tablet (1,000 mg total) by mouth 3 (three) times daily.    Dispense:  21 tablet    Refill:  0    Order Specific Question:   Supervising Provider    Answer:   Chase Picket [id number]     *If you need refills on other medications prior to your next appointment, please contact your pharmacy*  Follow-Up: Call back or seek an in-person evaluation if the symptoms worsen or if the condition fails to improve as anticipated.  [place] [phone]  Other Instructions Safe sex    If you have been instructed to have an in-person evaluation today at a local Urgent Care facility, please use the link below. It will take you to a list of all of our available [facility], including address, phone number and hours of operation. Please do not delay care.  [facility]  If you or a family member do not have a primary care provider, use the link below to schedule a visit and establish care. When you choose a  [facility] primary care physician or advanced practice provider, you gain a long-term partner in health. Find a Primary Care Provider  Learn more about [facility]'s in-office and virtual care options: [facility]

## 2022-03-24 ENCOUNTER — Telehealth: Payer: Self-pay

## 2022-03-24 NOTE — Telephone Encounter (Signed)
Pt has NOB scheduled for 4/9. Pt called stating she had intercourse this morning and had spotting after. Pt did put a pad on and has noticed brown blood on the pad. Pt denies cramping or pain. Pt states it is not bleeding like a period. Pt was told that intercourse can cause spotting in pregnancy. Pt was told to monitor the spotting and if it becomes heavy or she experiences any pain that she can go to [facility] for evaluation. Pt was told to call tomorrow if spotting is still happening. Pt expressed understanding.

## 2022-06-03 ENCOUNTER — Ambulatory Visit: Payer: No Typology Code available for payment source | Admitting: Family Medicine

## 2022-07-21 ENCOUNTER — Encounter: Payer: Self-pay | Admitting: Family Medicine

## 2022-07-21 ENCOUNTER — Ambulatory Visit: Payer: No Typology Code available for payment source | Admitting: Family Medicine

## 2022-07-21 VITALS — BP 128/88 | HR 91 | Ht 62.0 in | Wt 252.0 lb

## 2022-07-21 DIAGNOSIS — R8290 Unspecified abnormal findings in urine: Secondary | ICD-10-CM | POA: Insufficient documentation

## 2022-07-21 DIAGNOSIS — M7989 Other specified soft tissue disorders: Secondary | ICD-10-CM

## 2022-07-21 LAB — POCT URINALYSIS DIPSTICK
Bilirubin, UA: NEGATIVE
Glucose, UA: NEGATIVE
Ketones, UA: NEGATIVE
Nitrite, UA: NEGATIVE
Protein, UA: NEGATIVE
Spec Grav, UA: 1.015 (ref 1.010–1.025)
Urobilinogen, UA: 0.2 E.U./dL
pH, UA: 5.5 (ref 5.0–8.0)

## 2022-07-21 NOTE — Progress Notes (Signed)
Acute Office Visit  Subjective:     Patient ID: Renee Skinner, female    DOB: [birth year], 37 y.o.   MRN: [id number]  Chief Complaint  Patient presents with   Arm Pain    Right arm, swollen and feels tight ongoing for about a month   Urine Odor    Urine has a strong ammonium smell to it, has started multi vitamins thinks it may be from that. Ongoing for about 3 weeks    HPI Presents today for an acute visit with complaint of urine odor and right arm swelling. Started taking multivitamin and wonders if the odor is from that. The right arm swelling has been present for one month. Odor present for 3 weeks. No dietary changes.  Associated symptoms include: no fever or chills or flank pain Pertinent negatives: no dysuria, urgency, frequency, no urinary symptoms.  Pain severity: 0/10 Treatments tried include : n/a Treatment effective : n/a Staying hydrated.    Right arm swelling: right arm with some "tightness" with bending right elbow. Has tingling in both hands, this is not new. The swelling is new. ROM intact, sensation intact, radial pulse 2+. Denies new activities or injury. No pain. Does not bother me, can feel the tightness with bending.   Return from DR on 6/6. Right arm swelling present before trip.    Review of Systems  Constitutional:  Positive for malaise/fatigue. Negative for chills and fever.  Genitourinary:  Negative for dysuria, flank pain, frequency, hematuria and urgency.       Odor to urine that smells like ammonia.   Had abortion in April and has been spotting since that.         Objective:    BP 128/88   Pulse 91   Ht 5\' 2"  (1.575 m)   Wt 252 lb (114.3 kg)   SpO2 100%   BMI 46.09 kg/m    Physical Exam Vitals and nursing note reviewed.  Constitutional:      General: She is not in acute distress.    Appearance: Normal appearance. She is obese.  Cardiovascular:     Rate and Rhythm: Regular rhythm.     Heart sounds: Normal heart sounds.   Pulmonary:     Effort: Pulmonary effort is normal.     Breath sounds: Normal breath sounds.  Abdominal:     Tenderness: There is no right CVA tenderness or left CVA tenderness.  Musculoskeletal:        General: Swelling present. No tenderness or signs of injury.     Comments: Right arm with some swelling and tightness with bending of elbow. No pain, ROM intact. Sensation intact. No erythema or warmth.   Skin:    General: Skin is warm and dry.     Capillary Refill: Capillary refill takes less than 2 seconds.  Neurological:     General: No focal deficit present.     Mental Status: She is alert. Mental status is at baseline.  Psychiatric:        Mood and Affect: Mood normal.        Behavior: Behavior normal.        Thought Content: Thought content normal.        Judgment: Judgment normal.    Results for orders placed or performed in visit on 07/21/22  POCT Urinalysis Dipstick  Result Value Ref Range   Color, UA Yellow    Clarity, UA Cloudy    Glucose, UA Negative Negative   Bilirubin, UA Negative  Ketones, UA Negative    Spec Grav, UA 1.015 1.010 - 1.025   Blood, UA Moderate    pH, UA 5.5 5.0 - 8.0   Protein, UA Negative Negative   Urobilinogen, UA 0.2 0.2 or 1.0 E.U./dL   Nitrite, UA Negative    Leukocytes, UA Trace (A) Negative   Appearance Cloudy    Odor Foul, ammonia   Had spotting this morning since having abortion in April.       Assessment & Plan:   Abnormal urine odor  Urine dip with moderate blood which could be related to vaginal bleeding. Trace leukocytes. No urinary symptoms. No fever. No flank pain.  Will send for culture before antibiotic.  -     POCT urinalysis dipstick -     Urine Culture  Swelling of upper arm  Symptoms present for one month. Recommend elevating at night. Ice for 20 minutes at a time, follow-up with PCP if symptoms do not resolve. Symptoms reviewed that indicate worsening symptoms.    No orders of the defined types were placed  in this encounter.   Return if symptoms worsen or fail to improve.  Novella Olive, FNP

## 2022-07-21 NOTE — Patient Instructions (Signed)
Continue drinking lots of water We will send urine for culture to make sure there is not bacteria growing.   Keep an eye on the right arm. Elevate it at night. Follow-up with Dr. Ashley Royalty.

## 2022-07-24 LAB — URINE CULTURE

## 2022-08-03 ENCOUNTER — Telehealth: Payer: Self-pay | Admitting: Family Medicine

## 2022-08-03 DIAGNOSIS — G43819 Other migraine, intractable, without status migrainosus: Secondary | ICD-10-CM

## 2022-08-03 NOTE — Telephone Encounter (Signed)
Patient is requesting a referral for new neurologist  Dr. Kirt Boys [facility]  Fax number [phone]

## 2022-08-04 NOTE — Telephone Encounter (Signed)
Completed.

## 2022-09-08 ENCOUNTER — Telehealth: Payer: Self-pay | Admitting: Family Medicine

## 2022-09-08 NOTE — Telephone Encounter (Signed)
Patient is requesting to change providers due to availability in doctor's schedule

## 2022-09-08 NOTE — Telephone Encounter (Signed)
Please clarify. Is she having trouble getting in with me?  I typically have some availability almost every day.

## 2022-09-14 NOTE — Telephone Encounter (Signed)
Patient called and requested to [facility] from Dr. Ashley Royalty to Dorena Bodo.   Patient stated that availability for the needs of her schedule was limited and that, at her last acute visit with Dorena Bodo, she felt she connected better with a female provider. Reviewed telephone encounter below and scheduled for a New Patient with Dorena Bodo based on previous PCP's ok for 09/27/2022.  Katha Hamming

## 2022-09-27 ENCOUNTER — Encounter: Payer: Self-pay | Admitting: Family Medicine

## 2022-09-27 ENCOUNTER — Ambulatory Visit: Payer: No Typology Code available for payment source | Admitting: Family Medicine

## 2022-09-27 VITALS — BP 109/73 | HR 90 | Temp 97.8°F | Resp 18 | Ht 62.0 in | Wt 252.6 lb

## 2022-09-27 DIAGNOSIS — Z6841 Body Mass Index (BMI) 40.0 and over, adult: Secondary | ICD-10-CM | POA: Diagnosis not present

## 2022-09-27 DIAGNOSIS — Z136 Encounter for screening for cardiovascular disorders: Secondary | ICD-10-CM

## 2022-09-27 DIAGNOSIS — Z13220 Encounter for screening for lipoid disorders: Secondary | ICD-10-CM

## 2022-09-27 DIAGNOSIS — Z0000 Encounter for general adult medical examination without abnormal findings: Secondary | ICD-10-CM | POA: Diagnosis not present

## 2022-09-27 DIAGNOSIS — R5382 Chronic fatigue, unspecified: Secondary | ICD-10-CM | POA: Diagnosis not present

## 2022-09-27 DIAGNOSIS — Z7251 High risk heterosexual behavior: Secondary | ICD-10-CM | POA: Insufficient documentation

## 2022-09-27 DIAGNOSIS — G4733 Obstructive sleep apnea (adult) (pediatric): Secondary | ICD-10-CM | POA: Insufficient documentation

## 2022-09-27 DIAGNOSIS — R202 Paresthesia of skin: Secondary | ICD-10-CM | POA: Insufficient documentation

## 2022-09-27 NOTE — Assessment & Plan Note (Signed)
Has seen ortho. It has been 2 years, she is unsure if she is still active. She will notify provider if she needs a new referral. Phalen and Tinel signs negative.

## 2022-09-27 NOTE — Progress Notes (Signed)
Complete physical exam  Patient: Renee Skinner   DOB: February 05, 1985   37 y.o. Female  MRN: [id number]  Subjective:    Chief Complaint  Patient presents with  . Annual Exam    Renee Skinner is a 37 y.o. female who presents today for a complete physical exam. She reports consuming a  trying to lose weight  diet.  Walking 2 miles some days.   She generally feels well. She reports sleeping fairly well. She does have additional problems to discuss today.    Tingling in hands. Has seen ortho. It has been 2 years, she is unsure if she is still active. She will notify provider if she needs a new referral. Phalen and Tinel signs negative.   Sleep apnea: sees new doctor in November, uses CPAP.  Feels well with current CPAP settings.   History reviewed. Medications reviewed.  Not currently being treated for chronic conditions.   Most recent fall risk assessment:    09/27/2022    8:39 AM  Fall Risk   Falls in the past year? 0  Number falls in past yr: 0  Injury with Fall? 0  Risk for fall due to : No Fall Risks  Follow up Falls evaluation completed     Most recent depression screenings:    09/27/2022    8:39 AM 09/23/2021    4:01 PM  PHQ 2/9 Scores  PHQ - 2 Score 1 1  PHQ- 9 Score 5     Vision:Not within last year  and Dental: No current dental problems and Receives regular dental care    Patient Care Team: Novella Olive, FNP as PCP - General (Family Medicine)   Outpatient Medications Prior to Visit  Medication Sig Note  . Multiple Vitamins-Minerals (MULTIVITAMIN WITH MINERALS) tablet Take 1 tablet by mouth daily.   . valACYclovir (VALTREX) 1000 MG tablet Take 1 tablet (1,000 mg total) by mouth 3 (three) times daily. 09/27/2022: AS NEEDED   No facility-administered medications prior to visit.    Review of Systems  Constitutional:  Positive for malaise/fatigue.  Respiratory:  Negative for shortness of breath.   Cardiovascular:  Negative for chest pain.  Gastrointestinal:   Negative for nausea and vomiting.  Neurological:  Negative for headaches.  Psychiatric/Behavioral:  Negative for depression and suicidal ideas. The patient is not nervous/anxious.           Objective:     BP 109/73   Pulse 90   Temp 97.8 F (36.6 C) (Oral)   Resp 18   Ht 5\' 2"  (1.575 m)   Wt 252 lb 9.6 oz (114.6 kg)   LMP 09/19/2022 (Exact Date)   SpO2 99%   BMI 46.20 kg/m  BP Readings from Last 3 Encounters:  09/27/22 109/73  07/21/22 128/88  09/23/21 121/84      Physical Exam Vitals and nursing note reviewed.  Constitutional:      General: She is not in acute distress.    Appearance: Normal appearance.  HENT:     Right Ear: Tympanic membrane normal.     Left Ear: Tympanic membrane normal.     Nose: Nose normal.     Mouth/Throat:     Mouth: Mucous membranes are moist.     Pharynx: Oropharynx is clear.  Eyes:     Extraocular Movements: Extraocular movements intact.  Neck:     Thyroid: No thyroid tenderness.  Cardiovascular:     Rate and Rhythm: Normal rate and regular rhythm.  Pulses:          Radial pulses are 2+ on the right side and 2+ on the left side.     Heart sounds: Normal heart sounds, S1 normal and S2 normal.  Pulmonary:     Effort: Pulmonary effort is normal.     Breath sounds: Normal breath sounds.  Abdominal:     General: Bowel sounds are normal.     Palpations: Abdomen is soft.     Tenderness: There is no abdominal tenderness.  Musculoskeletal:        General: Normal range of motion.     Cervical back: Normal range of motion.     Right lower leg: No edema.     Left lower leg: No edema.     Comments: Negative Phalen and Tinel signs.   Lymphadenopathy:     Cervical:     Right cervical: No superficial cervical adenopathy.    Left cervical: No superficial cervical adenopathy.  Skin:    General: Skin is warm and dry.  Neurological:     General: No focal deficit present.     Mental Status: She is alert. Mental status is at baseline.   Psychiatric:        Mood and Affect: Mood normal.        Behavior: Behavior normal.        Thought Content: Thought content normal.        Judgment: Judgment normal.     No results found for any visits on 09/27/22.     Assessment & Plan:    Routine Health Maintenance and Physical Exam  Immunization History  Administered Date(s) Administered  . DTaP 09/12/1986, 12/20/1986, 05/20/1987, 10/01/1991  . Hepatitis B 10/08/1997, 07/08/2004  . IPV 12/20/1986, 09/12/1987, 10/01/1991  . MMR 09/12/1987, 10/01/1991  . Meningococcal polysaccharide vaccine (MPSV4) 07/08/2004  . PFIZER(Purple Top)SARS-COV-2 Vaccination 10/31/2019, 11/30/2019  . Td 10/08/1997  . Tdap 08/10/2011    Health Maintenance  Topic Date Due  . PAP SMEAR-Modifier  02/11/2018  . DTaP/Tdap/Td (7 - Td or Tdap) 08/09/2021  . COVID-19 Vaccine (3 - 2023-24 season) 09/19/2022  . INFLUENZA VACCINE  10/11/2022 (Originally 08/19/2022)  . Hepatitis C Screening  Completed  . HIV Screening  Completed  . HPV VACCINES  Aged Out    Discussed health benefits of physical activity, and encouraged her to engage in regular exercise appropriate for her age and condition.  Annual physical exam -     CBC with Differential/Platelet -     Comprehensive metabolic panel  Encounter for lipid screening for cardiovascular disease -     Lipid panel  Chronic fatigue -     CBC with Differential/Platelet -     Comprehensive metabolic panel -     TSH + free T4  BMI 45.0-49.9, adult (HCC) -     Comprehensive metabolic panel -     Hemoglobin A1c -     TSH + free T4  Unprotected sexual intercourse -     HIV Antibody (routine testing w rflx)  OSA (obstructive sleep apnea) Assessment & Plan: Sees new doctor in November, uses CPAP.  Feels well with current CPAP settings.    Hand tingling Assessment & Plan:  Has seen ortho. It has been 2 years, she is unsure if she is still active. She will notify provider if she needs a new referral.  Phalen and Tinel signs negative.         Routine labs ordered.  HCM reviewed/discussed. Will return  for pap smear.  Anticipatory guidance regarding healthy weight, lifestyle and choices given. Recommend healthy diet.  Recommend approximately 150 minutes/week of moderate intensity exercise. Resistance training is good for building muscles and for bone health. Muscle mass helps to increase our metabolism and to burn more calories at rest.  Limit alcohol consumption: no more than one drink per day for women and 2 drinks per day for me. Recommend regular dental and vision exams. Always use seatbelt/lap and shoulder restraints. Recommend using smoke alarms and checking batteries at least twice a year. Recommend using sunscreen when outside.  Please know that I am here to help you with all of your health care goals and am happy to work with you to find a solution that works best for you.  The greatest advice I have received with any changes in life are to take it one step at a time, that even means if all you can focus on is the next 60 seconds, then do that and celebrate your victories.  With any changes in life, you will have set backs, and that is OK. The important thing to remember is, if you have a set back, it is not a failure, it is an opportunity to try again! Agrees with plan of care discussed.  Questions answered.      Return in about 2 weeks (around 10/11/2022) for pap only .     Novella Olive, FNP

## 2022-09-27 NOTE — Assessment & Plan Note (Signed)
Sees new doctor in November, uses CPAP.  Feels well with current CPAP settings.

## 2022-09-28 ENCOUNTER — Other Ambulatory Visit: Payer: Self-pay | Admitting: Family Medicine

## 2022-09-28 DIAGNOSIS — R7303 Prediabetes: Secondary | ICD-10-CM | POA: Insufficient documentation

## 2022-09-28 DIAGNOSIS — D649 Anemia, unspecified: Secondary | ICD-10-CM | POA: Insufficient documentation

## 2022-09-28 LAB — COMPREHENSIVE METABOLIC PANEL
ALT: 18 IU/L (ref 0–32)
AST: 14 IU/L (ref 0–40)
Albumin: 4 g/dL (ref 3.9–4.9)
Alkaline Phosphatase: 114 IU/L (ref 44–121)
BUN/Creatinine Ratio: 16 (ref 9–23)
BUN: 15 mg/dL (ref 6–20)
Bilirubin Total: 0.2 mg/dL (ref 0.0–1.2)
CO2: 23 mmol/L (ref 20–29)
Calcium: 9.9 mg/dL (ref 8.7–10.2)
Chloride: 102 mmol/L (ref 96–106)
Creatinine, Ser: 0.92 mg/dL (ref 0.57–1.00)
Globulin, Total: 3.7 g/dL (ref 1.5–4.5)
Glucose: 95 mg/dL (ref 70–99)
Potassium: 4.9 mmol/L (ref 3.5–5.2)
Sodium: 137 mmol/L (ref 134–144)
Total Protein: 7.7 g/dL (ref 6.0–8.5)
eGFR: 83 mL/min/{1.73_m2} (ref >59)

## 2022-09-28 LAB — CBC WITH DIFFERENTIAL/PLATELET
Basophils Absolute: 0.1 10*3/uL (ref 0.0–0.2)
Basos: 1 %
EOS (ABSOLUTE): 0.1 10*3/uL (ref 0.0–0.4)
Eos: 1 %
Hematocrit: 37.2 % (ref 34.0–46.6)
Hemoglobin: 11.1 g/dL (ref 11.1–15.9)
Immature Grans (Abs): 0 10*3/uL (ref 0.0–0.1)
Immature Granulocytes: 0 %
Lymphocytes Absolute: 3.3 10*3/uL — ABNORMAL HIGH (ref 0.7–3.1)
Lymphs: 39 %
MCH: 23.6 pg — ABNORMAL LOW (ref 26.6–33.0)
MCHC: 29.8 g/dL — ABNORMAL LOW (ref 31.5–35.7)
MCV: 79 fL (ref 79–97)
Monocytes Absolute: 0.6 10*3/uL (ref 0.1–0.9)
Monocytes: 7 %
Neutrophils Absolute: 4.4 10*3/uL (ref 1.4–7.0)
Neutrophils: 52 %
Platelets: 437 10*3/uL (ref 150–450)
RBC: 4.71 x10E6/uL (ref 3.77–5.28)
RDW: 16.9 % — ABNORMAL HIGH (ref 11.7–15.4)
WBC: 8.4 10*3/uL (ref 3.4–10.8)

## 2022-09-28 LAB — LIPID PANEL
Chol/HDL Ratio: 2.2 ratio (ref 0.0–4.4)
Cholesterol, Total: 113 mg/dL (ref 100–199)
HDL: 52 mg/dL (ref >39)
LDL Chol Calc (NIH): 40 mg/dL (ref 0–99)
Triglycerides: 119 mg/dL (ref 0–149)
VLDL Cholesterol Cal: 21 mg/dL (ref 5–40)

## 2022-09-28 LAB — TSH+FREE T4
Free T4: 1.08 ng/dL (ref 0.82–1.77)
TSH: 0.484 u[IU]/mL (ref 0.450–4.500)

## 2022-09-28 LAB — HEMOGLOBIN A1C
Est. average glucose Bld gHb Est-mCnc: 128 mg/dL
Hgb A1c MFr Bld: 6.1 % — ABNORMAL HIGH (ref 4.8–5.6)

## 2022-09-28 LAB — HIV ANTIBODY (ROUTINE TESTING W REFLEX): HIV Screen 4th Generation wRfx: NONREACTIVE

## 2022-09-28 MED ORDER — METFORMIN HCL 500 MG PO TABS: 500.0000 mg | ORAL_TABLET | Freq: Two times a day (BID) | ORAL | 0 refills | Status: DC

## 2022-10-02 LAB — IRON,TIBC AND FERRITIN PANEL
Ferritin: 17 ng/mL (ref 15–150)
Iron Saturation: 9 % — CL (ref 15–55)
Iron: 30 ug/dL (ref 27–159)
Total Iron Binding Capacity: 335 ug/dL (ref 250–450)
UIBC: 305 ug/dL (ref 131–425)

## 2022-10-04 ENCOUNTER — Other Ambulatory Visit: Payer: Self-pay | Admitting: Family Medicine

## 2022-10-04 DIAGNOSIS — E611 Iron deficiency: Secondary | ICD-10-CM

## 2022-10-04 MED ORDER — FERROUS GLUCONATE 324 (38 FE) MG PO TABS: 324.0000 mg | ORAL_TABLET | Freq: Every day | ORAL | 0 refills | Status: DC

## 2022-10-11 ENCOUNTER — Ambulatory Visit: Payer: No Typology Code available for payment source | Admitting: Family Medicine

## 2022-10-11 ENCOUNTER — Ambulatory Visit (INDEPENDENT_AMBULATORY_CARE_PROVIDER_SITE_OTHER): Payer: No Typology Code available for payment source | Admitting: Family Medicine

## 2022-10-11 ENCOUNTER — Encounter: Payer: Self-pay | Admitting: Family Medicine

## 2022-10-11 VITALS — BP 122/78 | HR 100 | Temp 98.0°F | Resp 18 | Ht 62.0 in | Wt 252.1 lb

## 2022-10-11 DIAGNOSIS — Z124 Encounter for screening for malignant neoplasm of cervix: Secondary | ICD-10-CM | POA: Diagnosis not present

## 2022-10-11 DIAGNOSIS — R7303 Prediabetes: Secondary | ICD-10-CM | POA: Diagnosis not present

## 2022-10-11 MED ORDER — METFORMIN HCL 500 MG PO TABS: 500.0000 mg | ORAL_TABLET | Freq: Two times a day (BID) | ORAL | 0 refills | Status: DC

## 2022-10-11 NOTE — Assessment & Plan Note (Signed)
Tolerated pap smear well.

## 2022-10-11 NOTE — Progress Notes (Signed)
Established Patient Office Visit  Subjective   Patient ID: Renee Skinner, female    DOB: 1985/09/22  Age: 37 y.o. MRN: [id number]  Chief Complaint  Patient presents with   Gynecologic Exam    HPI  Presents for pap smear;  CPE on 09/27/22.   Pre-diabetes: taking metformin  500 mg once per day, tolerating well, ready to increase to BID with meals. Refill sent.    Review of Systems  Gastrointestinal:  Negative for constipation and diarrhea.      Objective:     BP 122/78   Pulse 100   Temp 98 F (36.7 C) (Oral)   Resp 18   Ht 5\' 2"  (1.575 m)   Wt 252 lb 1.6 oz (114.4 kg)   LMP 09/19/2022 (Exact Date)   SpO2 100%   BMI 46.11 kg/m  BP Readings from Last 3 Encounters:  10/11/22 122/78  09/27/22 109/73  07/21/22 128/88      Physical Exam Vitals and nursing note reviewed. Exam conducted with a chaperone present.  Constitutional:      General: She is not in acute distress.    Appearance: Normal appearance. She is obese.  Genitourinary:    Exam position: Lithotomy position.     Cervix: Normal.     Adnexa: Right adnexa normal and left adnexa normal.  Skin:    General: Skin is warm and dry.  Neurological:     General: No focal deficit present.     Mental Status: She is alert. Mental status is at baseline.  Psychiatric:        Mood and Affect: Mood normal.        Behavior: Behavior normal.        Thought Content: Thought content normal.        Judgment: Judgment normal.     No results found for any visits on 10/11/22.  Last hemoglobin A1c Lab Results  Component Value Date   HGBA1C 6.1 (H) 09/27/2022      The ASCVD Risk score (Arnett DK, et al., 2019) failed to calculate for the following reasons:   The 2019 ASCVD risk score is only valid for ages 63 to 39    Assessment & Plan:   Problem List Items Addressed This Visit     Prediabetes    Taking metformin 500 mg daily with meals. Tolerating well, will increase to twice per day with meals. Making  significant lifestyle changes, watching sugar and processed foods. Follow-up in 3 months for A1C. Refill sent.       Relevant Medications   metFORMIN (GLUCOPHAGE) 500 MG tablet   Screening for cervical cancer - Primary    Tolerated pap smear well.       Relevant Orders   IGP, Aptima HPV  Agrees with plan of care discussed.  Questions answered.   Return in about 3 months (around 01/10/2023) for pre diabetes .    Novella Olive, FNP

## 2022-10-11 NOTE — Assessment & Plan Note (Signed)
Taking metformin 500 mg daily with meals. Tolerating well, will increase to twice per day with meals. Making significant lifestyle changes, watching sugar and processed foods. Follow-up in 3 months for A1C. Refill sent.

## 2022-10-14 LAB — IGP, APTIMA HPV
HPV Aptima: NEGATIVE
PAP Smear Comment: 0

## 2022-10-25 ENCOUNTER — Ambulatory Visit: Payer: No Typology Code available for payment source | Admitting: Family Medicine

## 2022-12-20 ENCOUNTER — Other Ambulatory Visit: Payer: Self-pay | Admitting: Family Medicine

## 2022-12-20 ENCOUNTER — Telehealth: Payer: Self-pay | Admitting: Family Medicine

## 2022-12-20 DIAGNOSIS — B009 Herpesviral infection, unspecified: Secondary | ICD-10-CM | POA: Insufficient documentation

## 2022-12-20 MED ORDER — VALACYCLOVIR HCL 500 MG PO TABS: 500.0000 mg | ORAL_TABLET | Freq: Two times a day (BID) | ORAL | 0 refills | Status: AC

## 2022-12-20 NOTE — Telephone Encounter (Signed)
Refill sent.

## 2022-12-20 NOTE — Telephone Encounter (Signed)
Copied from CRM [id number]. Topic: Clinical - Medication Refill >> Dec 20, 2022  9:47 AM Herbert Seta B wrote: Most Recent Primary Care Visit:  Provider: Novella Olive  Department: [facility]  Visit Type: OFFICE VISIT  Date: 10/11/2022  Medication:  valACYclovir (VALTREX) 1000 MG tablet    Has the patient contacted their pharmacy? Yes-no refills (Agent: If no, request that the patient contact the pharmacy for the refill. If patient does not wish to contact the pharmacy document the reason why and proceed with request.) (Agent: If yes, when and what did the pharmacy advise?)  Is this the correct pharmacy for this prescription? yes If no, delete pharmacy and type the correct one.  This is the patient's preferred pharmacy:  [id number] - [place], [place] - [address] [place]  [address] Phone: [phone] Fax: [phone]    Has the prescription been filled recently? yes  Is the patient out of the medication? yes  Has the patient been seen for an appointment in the last year OR does the patient have an upcoming appointment? yes  Can we respond through MyChart? yes  Agent: Please be advised that Rx refills may take up to 3 business days. We ask that you follow-up with your pharmacy.

## 2023-01-10 ENCOUNTER — Encounter: Payer: Self-pay | Admitting: Family Medicine

## 2023-01-10 ENCOUNTER — Ambulatory Visit (INDEPENDENT_AMBULATORY_CARE_PROVIDER_SITE_OTHER): Payer: No Typology Code available for payment source | Admitting: Family Medicine

## 2023-01-10 VITALS — BP 112/81 | HR 98 | Temp 98.1°F | Resp 18 | Ht 62.0 in | Wt 248.2 lb

## 2023-01-10 DIAGNOSIS — E611 Iron deficiency: Secondary | ICD-10-CM | POA: Diagnosis not present

## 2023-01-10 DIAGNOSIS — R7303 Prediabetes: Secondary | ICD-10-CM | POA: Diagnosis not present

## 2023-01-10 MED ORDER — METFORMIN HCL 500 MG PO TABS: 500.0000 mg | ORAL_TABLET | Freq: Two times a day (BID) | ORAL | 0 refills | Status: DC

## 2023-01-10 NOTE — Assessment & Plan Note (Signed)
Completed iron supplements as prescribed. Will recheck iron today. Information provided on iron rich diet. Will refill iron if needed once lab is back.

## 2023-01-10 NOTE — Progress Notes (Signed)
Established Patient Office Visit  Subjective   Patient ID: Renee Skinner, female    DOB: 06-27-1985  Age: 37 y.o. MRN: [id number]  Chief Complaint  Patient presents with   Medical Management of Chronic Issues    Patient is here for a 3 month follow up for DM    HPI  Diabetes: Medication compliance: Taking metformin 500 mg BID  Denies chest pain, shortness of breath, vision changes, polydipsia, polyphagia, polyuria. Denies hypoglycemia.  Pertinent lab work: A1C: last A1C 9/9 6.1  Monitoring: blood sugar readings at home: not testing at home.           Continue current medication regimen: no changes, will check A1C today.   Well controlled: check A1C.   Follow-up: 3 months  Iron deficiency: Took ferrous gluconate 324 mg daily. Completed as prescribed.  Will recheck iron today.     Review of Systems  Eyes:  Negative for blurred vision and double vision.  Respiratory:  Negative for shortness of breath.   Cardiovascular:  Negative for chest pain.  Endo/Heme/Allergies:  Negative for polydipsia.     Objective:     BP 112/81   Pulse 98   Temp 98.1 F (36.7 C) (Oral)   Resp 18   Ht 5\' 2"  (1.575 m)   Wt 248 lb 3.2 oz (112.6 kg)   SpO2 98%   BMI 45.40 kg/m  BP Readings from Last 3 Encounters:  01/10/23 112/81  10/11/22 122/78  09/27/22 109/73      Physical Exam Vitals and nursing note reviewed.  Constitutional:      General: She is not in acute distress.    Appearance: Normal appearance. She is obese.  Cardiovascular:     Rate and Rhythm: Regular rhythm.     Heart sounds: Normal heart sounds.  Pulmonary:     Effort: Pulmonary effort is normal.     Breath sounds: Normal breath sounds.  Skin:    General: Skin is warm and dry.  Neurological:     General: No focal deficit present.     Mental Status: She is alert. Mental status is at baseline.  Psychiatric:        Mood and Affect: Mood normal.        Behavior: Behavior normal.        Thought Content:  Thought content normal.        Judgment: Judgment normal.    No results found for any visits on 01/10/23.  Last CBC Lab Results  Component Value Date   WBC 8.4 09/27/2022   HGB 11.1 09/27/2022   HCT 37.2 09/27/2022   MCV 79 09/27/2022   MCH 23.6 (L) 09/27/2022   RDW 16.9 (H) 09/27/2022   PLT 437 09/27/2022   Last hemoglobin A1c Lab Results  Component Value Date   HGBA1C 6.1 (H) 09/27/2022      The ASCVD Risk score (Arnett DK, et al., 2019) failed to calculate for the following reasons:   The 2019 ASCVD risk score is only valid for ages 19 to 76    Assessment & Plan:   Problem List Items Addressed This Visit     Prediabetes - Primary   Taking metformin 500 mg BID as prescribed. Tolerating well.  Denies chest pain, shortness of breath, vision changes, polydipsia, polyphagia, polyuria. Denies hypoglycemia.  Last A1C 6.1. Recheck today. Refill sent. Follow-up in 3 months.       Relevant Medications   metFORMIN (GLUCOPHAGE) 500 MG tablet   Other Relevant  Orders   Hemoglobin A1c   Iron deficiency   Completed iron supplements as prescribed. Will recheck iron today. Information provided on iron rich diet. Will refill iron if needed once lab is back.        Relevant Orders   Iron, TIBC and Ferritin Panel  Agrees with plan of care discussed.  Questions answered.   Return in about 3 months (around 04/10/2023) for DM.    Novella Olive, FNP

## 2023-01-10 NOTE — Assessment & Plan Note (Addendum)
Taking metformin 500 mg BID as prescribed. Tolerating well.  Denies chest pain, shortness of breath, vision changes, polydipsia, polyphagia, polyuria. Denies hypoglycemia.  Last A1C 6.1. Recheck today. Refill sent. Follow-up in 3 months.

## 2023-01-11 LAB — IRON,TIBC AND FERRITIN PANEL
Ferritin: 34 ng/mL (ref 15–150)
Iron Saturation: 10 % — ABNORMAL LOW (ref 15–55)
Iron: 33 ug/dL (ref 27–159)
Total Iron Binding Capacity: 329 ug/dL (ref 250–450)
UIBC: 296 ug/dL (ref 131–425)

## 2023-01-11 LAB — HEMOGLOBIN A1C
Est. average glucose Bld gHb Est-mCnc: 128 mg/dL
Hgb A1c MFr Bld: 6.1 % — ABNORMAL HIGH (ref 4.8–5.6)

## 2023-01-13 ENCOUNTER — Other Ambulatory Visit: Payer: Self-pay | Admitting: Family Medicine

## 2023-01-13 DIAGNOSIS — E611 Iron deficiency: Secondary | ICD-10-CM

## 2023-01-13 MED ORDER — FERROUS GLUCONATE 324 (38 FE) MG PO TABS: 324.0000 mg | ORAL_TABLET | Freq: Every day | ORAL | 0 refills | Status: DC

## 2023-01-20 ENCOUNTER — Other Ambulatory Visit: Payer: Self-pay | Admitting: Family Medicine

## 2023-01-20 ENCOUNTER — Encounter: Payer: Self-pay | Admitting: Family Medicine

## 2023-01-20 DIAGNOSIS — E611 Iron deficiency: Secondary | ICD-10-CM

## 2023-01-26 ENCOUNTER — Other Ambulatory Visit: Payer: Self-pay | Admitting: Family Medicine

## 2023-01-26 DIAGNOSIS — E611 Iron deficiency: Secondary | ICD-10-CM

## 2023-04-12 ENCOUNTER — Encounter: Payer: Self-pay | Admitting: Family Medicine

## 2023-04-12 ENCOUNTER — Ambulatory Visit (INDEPENDENT_AMBULATORY_CARE_PROVIDER_SITE_OTHER): Payer: Self-pay | Admitting: Family Medicine

## 2023-04-12 ENCOUNTER — Other Ambulatory Visit: Payer: Self-pay | Admitting: Family Medicine

## 2023-04-12 VITALS — BP 114/76 | HR 87 | Temp 98.1°F | Resp 18 | Ht 62.0 in | Wt 250.3 lb

## 2023-04-12 DIAGNOSIS — E611 Iron deficiency: Secondary | ICD-10-CM

## 2023-04-12 DIAGNOSIS — R7303 Prediabetes: Secondary | ICD-10-CM

## 2023-04-12 NOTE — Assessment & Plan Note (Addendum)
 Taking ferrous sulfate 324 mg daily as prescribed. Energy levels have improved. Recheck iron studies today. If continues to be low, will refer to heme for possible infusion.

## 2023-04-12 NOTE — Patient Instructions (Signed)
  Recommend heart healthy/Mediterranean diet, with whole grains, fruits, vegetable, fish, lean meats, nuts, and olive oil. Limit salt. Recommend moderate walking, 3-5 times/week for 30-50 minutes each session. Aim for at least 150 minutes.week. Goal should be pace of 3 miles/hours, or walking 1.5 miles in 30 minutes Recommend avoidance of tobacco products. Avoid excess alcohol.

## 2023-04-12 NOTE — Progress Notes (Signed)
   Established Patient Office Visit  Subjective   Patient ID: Renee Skinner, female    DOB: July 24, 1985  Age: 38 y.o. MRN: [id number]  Chief Complaint  Patient presents with   Medical Management of Chronic Issues    Patient is here for a follow up for HGA1c & Iron Levels     HPI  Diabetes: Medication compliance: Taking metformin 500 mg BID as prescribed.  Denies chest pain, shortness of breath, vision changes, polydipsia, polyphagia, polyuria. Denies hypoglycemia.  Pertinent lab work: A1C: 01/10/23: A1C 6.1  Monitoring: blood sugar readings at home: n/a           Continue current medication regimen: no changes today   Well controlled: will check A1C today.   Follow-up: 3 months   Taking ferrous sulfate 324 mg daily as prescribed.  Energy level is okay now. Recheck iron levels today.     Review of Systems  Respiratory:  Negative for shortness of breath.   Cardiovascular:  Negative for chest pain and leg swelling.  Endo/Heme/Allergies:  Negative for polydipsia.      Objective:     BP 114/76   Pulse 87   Temp 98.1 F (36.7 C) (Oral)   Resp 18   Ht 5\' 2"  (1.575 m)   Wt 250 lb 4.8 oz (113.5 kg)   SpO2 99%   BMI 45.78 kg/m    Physical Exam Vitals and nursing note reviewed.  Constitutional:      Appearance: Normal appearance. She is obese.  Cardiovascular:     Rate and Rhythm: Regular rhythm.     Heart sounds: Normal heart sounds.  Pulmonary:     Effort: Pulmonary effort is normal.     Breath sounds: Normal breath sounds.  Skin:    General: Skin is warm and dry.  Neurological:     General: No focal deficit present.     Mental Status: She is alert. Mental status is at baseline.  Psychiatric:        Mood and Affect: Mood normal.        Behavior: Behavior normal.        Thought Content: Thought content normal.        Judgment: Judgment normal.     No results found for any visits on 04/12/23.    The ASCVD Risk score (Arnett DK, et al., 2019) failed to  calculate for the following reasons:   The 2019 ASCVD risk score is only valid for ages 80 to 70    Assessment & Plan:   Problem List Items Addressed This Visit     Prediabetes   Taking metformin 500 mg BID as prescribed.  Denies chest pain, shortness of breath, vision changes, polydipsia, polyphagia, polyuria. Denies hypoglycemia.  Tolerating medication well.  Pertinent lab work: A1C: 01/10/23: A1C 6.1  A1C today. Discussed diet and exercise to prevent progression to diabetes and for overall general health. Follow-up in 3 months.       Relevant Orders   Hemoglobin A1c   Iron deficiency - Primary   Taking ferrous sulfate 324 mg daily as prescribed. Energy levels have improved. Recheck iron studies today. If continues to be low, will refer to heme for possible infusion.        Relevant Orders   Iron, TIBC and Ferritin Panel  Agrees with plan of care discussed.  Questions answered.   Return in about 3 months (around 07/13/2023) for DM.    Novella Olive, FNP

## 2023-04-12 NOTE — Assessment & Plan Note (Addendum)
 Taking metformin 500 mg BID as prescribed.  Denies chest pain, shortness of breath, vision changes, polydipsia, polyphagia, polyuria. Denies hypoglycemia.  Tolerating medication well.  Pertinent lab work: A1C: 01/10/23: A1C 6.1  A1C today. Discussed diet and exercise to prevent progression to diabetes and for overall general health. Follow-up in 3 months.

## 2023-04-13 ENCOUNTER — Encounter: Payer: Self-pay | Admitting: Family Medicine

## 2023-04-13 ENCOUNTER — Other Ambulatory Visit: Payer: Self-pay | Admitting: Family Medicine

## 2023-04-13 DIAGNOSIS — R7303 Prediabetes: Secondary | ICD-10-CM

## 2023-04-13 DIAGNOSIS — E611 Iron deficiency: Secondary | ICD-10-CM

## 2023-04-13 LAB — IRON,TIBC AND FERRITIN PANEL
Ferritin: 40 ng/mL (ref 15–150)
Iron Saturation: 15 % (ref 15–55)
Iron: 48 ug/dL (ref 27–159)
Total Iron Binding Capacity: 312 ug/dL (ref 250–450)
UIBC: 264 ug/dL (ref 131–425)

## 2023-04-13 LAB — HEMOGLOBIN A1C
Est. average glucose Bld gHb Est-mCnc: 123 mg/dL
Hgb A1c MFr Bld: 5.9 % — ABNORMAL HIGH (ref 4.8–5.6)

## 2023-04-13 MED ORDER — METFORMIN HCL 500 MG PO TABS: 500.0000 mg | ORAL_TABLET | Freq: Two times a day (BID) | ORAL | 0 refills | Status: DC

## 2023-04-13 MED ORDER — FERROUS GLUCONATE 324 (38 FE) MG PO TABS: 324.0000 mg | ORAL_TABLET | Freq: Every day | ORAL | 0 refills | Status: DC

## 2023-05-20 ENCOUNTER — Encounter: Payer: Self-pay | Admitting: Family Medicine

## 2023-07-13 ENCOUNTER — Ambulatory Visit (INDEPENDENT_AMBULATORY_CARE_PROVIDER_SITE_OTHER): Admitting: Family Medicine

## 2023-07-13 ENCOUNTER — Encounter: Payer: Self-pay | Admitting: Family Medicine

## 2023-07-13 VITALS — BP 115/83 | HR 88 | Ht 62.0 in | Wt 249.0 lb

## 2023-07-13 DIAGNOSIS — E66813 Obesity, class 3: Secondary | ICD-10-CM | POA: Diagnosis not present

## 2023-07-13 DIAGNOSIS — R7303 Prediabetes: Secondary | ICD-10-CM | POA: Diagnosis not present

## 2023-07-13 DIAGNOSIS — E669 Obesity, unspecified: Secondary | ICD-10-CM | POA: Insufficient documentation

## 2023-07-13 DIAGNOSIS — E611 Iron deficiency: Secondary | ICD-10-CM

## 2023-07-13 DIAGNOSIS — Z6841 Body Mass Index (BMI) 40.0 and over, adult: Secondary | ICD-10-CM

## 2023-07-13 MED ORDER — METFORMIN HCL 500 MG PO TABS: 500.0000 mg | ORAL_TABLET | Freq: Two times a day (BID) | ORAL | 1 refills | Status: DC

## 2023-07-13 NOTE — Progress Notes (Signed)
 Established Patient Office Visit  Subjective   Patient ID: Renee Skinner, female    DOB: 1985-08-06  Age: 38 y.o. MRN: [id number]  Chief Complaint  Patient presents with   Medical Management of Chronic Issues    56-month follow up for diabetes; wants to know if A1C needs to be checked today, wants to know if she still needs to continue to take iron, might need a refill on metformin , and would like a referral to healthy weight management clinic.    HPI  Diabetes: Medication compliance: Taking metformin  500 mg BID Denies chest pain, shortness of breath, vision changes, polydipsia, polyphagia, polyuria. Denies hypoglycemia.  Pertinent lab work: A1C: 04/12/23: A1C 5.9 Monitoring: blood sugar readings at home: does not check at home          Continue current medication regimen: no change  Well controlled: A1C today  Follow-up: 6 months   Iron deficiency: Taking iron supplement daily. Last iron study within low normal range.  Recheck today.   Obesity: Referral to healthy weight and wellness.  Has gotten off track.  Has been taking care of sick mother. Ready to focus on self again. Eaing lots of fruits. Avoid red meat.  ROS    Objective:     BP 115/83 (BP Location: Left Arm, Patient Position: Sitting)   Pulse 88   Ht 5' 2 (1.575 m)   Wt 249 lb (112.9 kg)   LMP 06/22/2023 (Exact Date)   SpO2 97%   BMI 45.54 kg/m  BP Readings from Last 3 Encounters:  07/13/23 115/83  04/12/23 114/76  01/10/23 112/81      Physical Exam Vitals and nursing note reviewed.  Constitutional:      Appearance: Normal appearance. She is obese.   Cardiovascular:     Rate and Rhythm: Regular rhythm.     Heart sounds: Normal heart sounds.  Pulmonary:     Effort: Pulmonary effort is normal.     Breath sounds: Normal breath sounds.   Skin:    General: Skin is warm and dry.   Neurological:     General: No focal deficit present.     Mental Status: She is alert. Mental status is at  baseline.   Psychiatric:        Mood and Affect: Mood normal.        Behavior: Behavior normal.        Thought Content: Thought content normal.        Judgment: Judgment normal.     No results found for any visits on 07/13/23.  Last hemoglobin A1c Lab Results  Component Value Date   HGBA1C 5.9 (H) 04/12/2023      The ASCVD Risk score (Arnett DK, et al., 2019) failed to calculate for the following reasons:   The 2019 ASCVD risk score is only valid for ages 44 to 49    Assessment & Plan:   Prediabetes Assessment & Plan:  Taking metformin  500 mg BID Denies chest pain, shortness of breath, vision changes, polydipsia, polyphagia, polyuria. Denies hypoglycemia.  A1C: 04/12/23: A1C 5.9 BMP, A1C today. Refill sent. Follow-up in 6 months  Orders: -     Basic metabolic panel with GFR -     Hemoglobin A1c -     metFORMIN  HCl; Take 1 tablet (500 mg total) by mouth 2 (two) times daily with a meal.  Dispense: 180 tablet; Refill: 1  Iron deficiency Assessment & Plan: Taking iron supplement daily. Last iron saturation 15. Recheck today. If  normal, consider stopping iron supplements.   Orders: -     Iron, TIBC and Ferritin Panel -     CBC  Class 3 severe obesity with serious comorbidity and body mass index (BMI) of 45.0 to 49.9 in adult, unspecified obesity type Assessment & Plan: Has gotten off track due to home responsibilities. Trying to get back on track with diet and exercise. Referral placed for Healthy Weight and Wellness.   Orders: -     Amb Ref to Medical Weight Management    Agrees with plan of care discussed.  Questions answered.   Return in about 6 months (around 01/12/2024) for DM.    Darice JONELLE Brownie, FNP

## 2023-07-13 NOTE — Assessment & Plan Note (Signed)
 Taking iron supplement daily. Last iron saturation 15. Recheck today. If normal, consider stopping iron supplements.

## 2023-07-13 NOTE — Assessment & Plan Note (Signed)
 Taking metformin  500 mg BID Denies chest pain, shortness of breath, vision changes, polydipsia, polyphagia, polyuria. Denies hypoglycemia.  A1C: 04/12/23: A1C 5.9 BMP, A1C today. Refill sent. Follow-up in 6 months

## 2023-07-13 NOTE — Assessment & Plan Note (Signed)
 Has gotten off track due to home responsibilities. Trying to get back on track with diet and exercise. Referral placed for Healthy Weight and Wellness.

## 2023-07-14 ENCOUNTER — Ambulatory Visit: Payer: Self-pay | Admitting: Family Medicine

## 2023-07-14 DIAGNOSIS — E611 Iron deficiency: Secondary | ICD-10-CM

## 2023-07-14 LAB — CBC
Hematocrit: 41.8 % (ref 34.0–46.6)
Hemoglobin: 12.8 g/dL (ref 11.1–15.9)
MCH: 26.4 pg — ABNORMAL LOW (ref 26.6–33.0)
MCHC: 30.6 g/dL — ABNORMAL LOW (ref 31.5–35.7)
MCV: 86 fL (ref 79–97)
Platelets: 381 10*3/uL (ref 150–450)
RBC: 4.84 x10E6/uL (ref 3.77–5.28)
RDW: 15.1 % (ref 11.7–15.4)
WBC: 9.6 10*3/uL (ref 3.4–10.8)

## 2023-07-14 LAB — BASIC METABOLIC PANEL WITH GFR
BUN/Creatinine Ratio: 14 (ref 9–23)
BUN: 11 mg/dL (ref 6–20)
CO2: 20 mmol/L (ref 20–29)
Calcium: 10 mg/dL (ref 8.7–10.2)
Chloride: 101 mmol/L (ref 96–106)
Creatinine, Ser: 0.8 mg/dL (ref 0.57–1.00)
Glucose: 97 mg/dL (ref 70–99)
Potassium: 4.8 mmol/L (ref 3.5–5.2)
Sodium: 136 mmol/L (ref 134–144)
eGFR: 97 mL/min/{1.73_m2} (ref >59)

## 2023-07-14 LAB — IRON,TIBC AND FERRITIN PANEL
Ferritin: 38 ng/mL (ref 15–150)
Iron Saturation: 11 % — ABNORMAL LOW (ref 15–55)
Iron: 38 ug/dL (ref 27–159)
Total Iron Binding Capacity: 333 ug/dL (ref 250–450)
UIBC: 295 ug/dL (ref 131–425)

## 2023-07-14 LAB — HEMOGLOBIN A1C
Est. average glucose Bld gHb Est-mCnc: 123 mg/dL
Hgb A1c MFr Bld: 5.9 % — ABNORMAL HIGH (ref 4.8–5.6)

## 2023-07-14 MED ORDER — FERROUS GLUCONATE 324 (38 FE) MG PO TABS
324.0000 mg | ORAL_TABLET | Freq: Every day | ORAL | 1 refills | Status: AC
Start: 2023-07-14 — End: ?

## 2023-08-03 ENCOUNTER — Encounter: Payer: Self-pay | Admitting: Nurse Practitioner

## 2023-08-03 ENCOUNTER — Ambulatory Visit: Admitting: Nurse Practitioner

## 2023-08-03 VITALS — BP 119/84 | HR 86 | Temp 98.0°F | Ht 62.0 in | Wt 247.0 lb

## 2023-08-03 DIAGNOSIS — Z6841 Body Mass Index (BMI) 40.0 and over, adult: Secondary | ICD-10-CM | POA: Diagnosis not present

## 2023-08-03 DIAGNOSIS — E66813 Obesity, class 3: Secondary | ICD-10-CM

## 2023-08-03 DIAGNOSIS — R7303 Prediabetes: Secondary | ICD-10-CM

## 2023-08-03 DIAGNOSIS — G4733 Obstructive sleep apnea (adult) (pediatric): Secondary | ICD-10-CM

## 2023-08-03 NOTE — Progress Notes (Signed)
 Office: [phone]  /  Fax: [phone]   Initial Visit  Renee Skinner was seen in clinic today to evaluate for obesity. She is interested in losing weight to improve overall health and reduce the risk of weight related complications. She presents today to review program treatment options, initial physical assessment, and evaluation.     She was referred by: PCP  When asked what else they would like to accomplish? She states: Adopt a healthier eating pattern and lifestyle, Improve energy levels and physical activity, Improve existing medical conditions, and Improve quality of life  When asked how has your weight affected you? She states: Contributed to orthopedic problems or mobility issues, Having fatigue, and Having poor endurance  Some associated conditions: Has directed sleep apnea syndrome, anemia, prediabetes  Contributing factors: family history of obesity, use of obesogenic medications: Contraceptives or hormonal therapy, chronic skipping of meals, and sedentary job  Weight promoting medications identified: Contraceptives or hormonal therapy  Current nutrition plan: IF eats noon-8pm  Current level of physical activity: walking 3 days per week 1-2 miles  Current or previous pharmacotherapy: Phentermine   Response to medication: Lost weight initially but was unable to sustain weight loss   Past medical history includes:   Past Medical History:  Diagnosis Date   Allergy    Anemia    Anxiety    Chlamydia    Cholestasis of pregnancy    Sleep apnea      Objective:   BP 119/84   Pulse 86   Temp 98 F (36.7 C)   Ht 5' 2 (1.575 m)   Wt 247 lb (112 kg)   LMP 07/23/2023 (Exact Date)   SpO2 99%   BMI 45.18 kg/m  She was weighed on the bioimpedance scale: Body mass index is 45.18 kg/m.  Peak Weight:255 lbs , Body Fat%:46.4%, Visceral Fat Rating:14, Weight trend over the last 12 months: Increasing  General:  Alert, oriented and cooperative. Patient is in no  acute distress.  Respiratory: Normal respiratory effort, no problems with respiration noted   Gait: able to ambulate independently  Mental Status: Normal mood and affect. Normal behavior. Normal judgment and thought content.   DIAGNOSTIC DATA REVIEWED:  BMET    Component Value Date/Time   NA 136 07/13/2023 0833   K 4.8 07/13/2023 0833   CL 101 07/13/2023 0833   CO2 20 07/13/2023 0833   GLUCOSE 97 07/13/2023 0833   GLUCOSE 90 09/23/2021 0000   GLUCOSE 63 (L) 06/21/2011 1719   BUN 11 07/13/2023 0833   CREATININE 0.80 07/13/2023 0833   CREATININE 0.87 09/23/2021 0000   CALCIUM 10.0 07/13/2023 0833   GFRNONAA 86 04/24/2020 0000   GFRAA 100 04/24/2020 0000   Lab Results  Component Value Date   HGBA1C 5.9 (H) 07/13/2023   HGBA1C 6.1 (H) 09/27/2022   No results found for: INSULIN CBC    Component Value Date/Time   WBC 9.6 07/13/2023 0833   WBC 9.4 09/23/2021 0000   RBC 4.84 07/13/2023 0833   RBC 4.79 09/23/2021 0000   HGB 12.8 07/13/2023 0833   HCT 41.8 07/13/2023 0833   PLT 381 07/13/2023 0833   MCV 86 07/13/2023 0833   MCH 26.4 (L) 07/13/2023 0833   MCH 27.6 09/23/2021 0000   MCHC 30.6 (L) 07/13/2023 0833   MCHC 32.5 09/23/2021 0000   RDW 15.1 07/13/2023 0833   Iron/TIBC/Ferritin/ %Sat    Component Value Date/Time   IRON 38 07/13/2023 0833   TIBC 333 07/13/2023 9166  FERRITIN 38 07/13/2023 0833   IRONPCTSAT 11 (L) 07/13/2023 0833   IRONPCTSAT 17 04/24/2020 0000   Lipid Panel     Component Value Date/Time   CHOL 113 09/27/2022 0847   TRIG 119 09/27/2022 0847   HDL 52 09/27/2022 0847   CHOLHDL 2.2 09/27/2022 0847   CHOLHDL 2.3 09/23/2021 0000   VLDL 20 02/12/2015 1138   LDLCALC 40 09/27/2022 0847   LDLCALC 47 09/23/2021 0000   Hepatic Function Panel     Component Value Date/Time   PROT 7.7 09/27/2022 0847   ALBUMIN 4.0 09/27/2022 0847   AST 14 09/27/2022 0847   ALT 18 09/27/2022 0847   ALKPHOS 114 09/27/2022 0847   BILITOT 0.2 09/27/2022 0847    BILIDIR <0.1 12/03/2012 2255   IBILI NOT CALCULATED 12/03/2012 2255      Component Value Date/Time   TSH 0.484 09/27/2022 0847     Assessment and Plan:   Prediabetes Patient is currently taking metformin  500 mg twice daily prescribed by her PCP.  Continue to follow up with PCP  OSA (obstructive sleep apnea) Severe, AHI 46.7 with o2 desaturations of 79%, Continue follow-up with neurology.  Continue CPAP nightly.  Class 3 severe obesity with serious comorbidity and body mass index (BMI) of 45.0 to 49.9 in adult, unspecified obesity type        Obesity Treatment / Action Plan:  Patient will work on garnering support from family and friends to begin weight loss journey. Will work on eliminating or reducing the presence of highly palatable, calorie dense foods in the home. Will complete provided nutritional and psychosocial assessment questionnaire before the next appointment. Will be scheduled for indirect calorimetry to determine resting energy expenditure in a fasting state.  This will allow us  to create a reduced calorie, high-protein meal plan to promote loss of fat mass while preserving muscle mass. Counseled on the health benefits of losing 5%-15% of total body weight. Was counseled on nutritional approaches to weight loss and benefits of reducing processed foods and consuming plant-based foods and high quality protein as part of nutritional weight management. Was counseled on pharmacotherapy and role as an adjunct in weight management.   Obesity Education Performed Today:  She was weighed on the bioimpedance scale and results were discussed and documented in the synopsis.  We discussed obesity as a disease and the importance of a more detailed evaluation of all the factors contributing to the disease.  We discussed the importance of long term lifestyle changes which include nutrition, exercise and behavioral modifications as well as the importance of customizing this to her  specific health and social needs.  We discussed the benefits of reaching a healthier weight to alleviate the symptoms of existing conditions and reduce the risks of the biomechanical, metabolic and psychological effects of obesity.  Renee Skinner appears to be in the action stage of change and states they are ready to start intensive lifestyle modifications and behavioral modifications.  30 minutes was spent today on this visit including the above counseling, pre-visit chart review, and post-visit documentation.  Reviewed by clinician on day of visit: allergies, medications, problem list, medical history, surgical history, family history, social history, and previous encounter notes pertinent to obesity diagnosis.    Renee Skinner SAUNDERS Madalin Hughart FNP-C

## 2023-10-06 ENCOUNTER — Other Ambulatory Visit: Payer: Self-pay | Admitting: Family Medicine

## 2023-10-06 ENCOUNTER — Encounter: Payer: Self-pay | Admitting: Family Medicine

## 2023-10-06 DIAGNOSIS — L700 Acne vulgaris: Secondary | ICD-10-CM | POA: Insufficient documentation

## 2024-01-16 ENCOUNTER — Ambulatory Visit: Admitting: Family Medicine

## 2024-01-24 ENCOUNTER — Encounter: Payer: Self-pay | Admitting: Family Medicine

## 2024-01-24 ENCOUNTER — Ambulatory Visit (INDEPENDENT_AMBULATORY_CARE_PROVIDER_SITE_OTHER): Payer: Self-pay | Admitting: Family Medicine

## 2024-01-24 VITALS — BP 132/82 | HR 93 | Temp 98.0°F | Ht 62.0 in | Wt 244.0 lb

## 2024-01-24 DIAGNOSIS — R7303 Prediabetes: Secondary | ICD-10-CM

## 2024-01-24 DIAGNOSIS — E611 Iron deficiency: Secondary | ICD-10-CM

## 2024-01-24 MED ORDER — METFORMIN HCL 500 MG PO TABS: 500.0000 mg | ORAL_TABLET | Freq: Two times a day (BID) | ORAL | 3 refills | Status: AC

## 2024-01-24 NOTE — Assessment & Plan Note (Addendum)
"   ferrous gluconate  324 mg daily Endorses missing some doses. Last  iron saturation 11 Endorses improved energy levels.  Recheck today.  Reports having iron at home.  "

## 2024-01-24 NOTE — Assessment & Plan Note (Addendum)
 metformin  500 mg BID  Tolerating well. 07/13/23: A1C 5.9 A1C today Refills sent. Follow-up in 6 months with new PCP.

## 2024-01-24 NOTE — Progress Notes (Signed)
" ° °  Established Patient Office Visit  Subjective   Patient ID: Renee Skinner, female    DOB: [birth year]  Age: 39 y.o. MRN: [id number]  Chief Complaint  Patient presents with   Diabetes    6 month follow up    Pre-diabetes: metformin  500 mg BID  Tolerating well. 07/13/23: A1C 5.9  Iron deficiency: ferrous gluconate  324 mg daily  iron saturation 11 BMP, A1C, Iron study today.  Reports improved energy. Struggles with focus. Endorses lots of things on her plate.          ROS    Objective:     BP 132/82 (BP Location: Left Arm, Patient Position: Sitting, Cuff Size: Large)   Pulse 93   Temp 98 F (36.7 C) (Oral)   Ht 5' 2 (1.575 m)   Wt 244 lb (110.7 kg)   LMP  (LMP Unknown)   SpO2 100%   BMI 44.63 kg/m    Physical Exam Vitals and nursing note reviewed.  Constitutional:      General: She is not in acute distress.    Appearance: Normal appearance.  Cardiovascular:     Rate and Rhythm: Normal rate and regular rhythm.     Heart sounds: Normal heart sounds.  Pulmonary:     Effort: Pulmonary effort is normal.     Breath sounds: Normal breath sounds.  Skin:    General: Skin is warm and dry.  Neurological:     General: No focal deficit present.     Mental Status: She is alert. Mental status is at baseline.  Psychiatric:        Mood and Affect: Mood normal.        Behavior: Behavior normal.        Thought Content: Thought content normal.        Judgment: Judgment normal.      No results found for any visits on 01/24/24.    The ASCVD Risk score (Arnett DK, et al., 2019) failed to calculate for the following reasons:   The 2019 ASCVD risk score is only valid for ages 13 to 51    Assessment & Plan:   Problem List Items Addressed This Visit     Prediabetes - Primary   metformin  500 mg BID  Tolerating well. 07/13/23: A1C 5.9 A1C today Refills sent. Follow-up in 6 months with new PCP.       Relevant Medications   metFORMIN  (GLUCOPHAGE ) 500 MG  tablet   Other Relevant Orders   Basic metabolic panel with GFR   Hemoglobin A1c   Iron deficiency    ferrous gluconate  324 mg daily Endorses missing some doses. Last  iron saturation 11 Endorses improved energy levels.  Recheck today.  Reports having iron at home.       Relevant Orders   Iron, TIBC and Ferritin Panel  Agrees with plan of care discussed.  Questions answered.   Return in about 6 months (around 07/23/2024) for pre-diabetes .    Darice JONELLE Brownie, FNP  "

## 2024-01-25 ENCOUNTER — Ambulatory Visit: Payer: Self-pay | Admitting: Family Medicine

## 2024-01-25 LAB — BASIC METABOLIC PANEL WITH GFR
BUN/Creatinine Ratio: 16 (ref 9–23)
BUN: 13 mg/dL (ref 6–20)
CO2: 22 mmol/L (ref 20–29)
Calcium: 9.8 mg/dL (ref 8.7–10.2)
Chloride: 102 mmol/L (ref 96–106)
Creatinine, Ser: 0.79 mg/dL (ref 0.57–1.00)
Glucose: 93 mg/dL (ref 70–99)
Potassium: 4.5 mmol/L (ref 3.5–5.2)
Sodium: 139 mmol/L (ref 134–144)
eGFR: 99 mL/min/1.73

## 2024-01-25 LAB — HEMOGLOBIN A1C
Est. average glucose Bld gHb Est-mCnc: 117 mg/dL
Hgb A1c MFr Bld: 5.7 % — ABNORMAL HIGH (ref 4.8–5.6)

## 2024-01-25 LAB — IRON,TIBC AND FERRITIN PANEL
Ferritin: 34 ng/mL (ref 15–150)
Iron Saturation: 11 % — ABNORMAL LOW (ref 15–55)
Iron: 38 ug/dL (ref 27–159)
Total Iron Binding Capacity: 333 ug/dL (ref 250–450)
UIBC: 295 ug/dL (ref 131–425)
